# Patient Record
Sex: Male | Born: 1985
Health system: Southern US, Community
[De-identification: ages and names within clinical notes are randomized; demographics above are authoritative.]

## PROBLEM LIST (undated history)

## (undated) DIAGNOSIS — R188 Other ascites: Secondary | ICD-10-CM

## (undated) DIAGNOSIS — Z9289 Personal history of other medical treatment: Secondary | ICD-10-CM

## (undated) DIAGNOSIS — I8500 Esophageal varices without bleeding: Secondary | ICD-10-CM

## (undated) DIAGNOSIS — K7030 Alcoholic cirrhosis of liver without ascites: Secondary | ICD-10-CM

## (undated) DIAGNOSIS — D649 Anemia, unspecified: Secondary | ICD-10-CM

## (undated) DIAGNOSIS — G629 Polyneuropathy, unspecified: Secondary | ICD-10-CM

---

## 2020-06-12 ENCOUNTER — Inpatient Hospital Stay (HOSPITAL_COMMUNITY)
Admission: EM | Admit: 2020-06-12 | Discharge: 2020-06-15 | DRG: 433 | Discharge status: Against Medical Advice | Payer: Self-pay | Attending: Internal Medicine | Admitting: Internal Medicine

## 2020-06-12 ENCOUNTER — Encounter (HOSPITAL_COMMUNITY): Payer: Self-pay | Admitting: *Deleted

## 2020-06-12 ENCOUNTER — Other Ambulatory Visit: Payer: Self-pay

## 2020-06-12 ENCOUNTER — Emergency Department (HOSPITAL_COMMUNITY): Payer: Self-pay

## 2020-06-12 DIAGNOSIS — K7200 Acute and subacute hepatic failure without coma: Secondary | ICD-10-CM

## 2020-06-12 DIAGNOSIS — Z8616 Personal history of COVID-19: Secondary | ICD-10-CM

## 2020-06-12 DIAGNOSIS — Z800 Family history of malignant neoplasm of digestive organs: Secondary | ICD-10-CM

## 2020-06-12 DIAGNOSIS — R188 Other ascites: Secondary | ICD-10-CM

## 2020-06-12 DIAGNOSIS — R64 Cachexia: Secondary | ICD-10-CM | POA: Diagnosis present

## 2020-06-12 DIAGNOSIS — D539 Nutritional anemia, unspecified: Secondary | ICD-10-CM | POA: Diagnosis present

## 2020-06-12 DIAGNOSIS — K7031 Alcoholic cirrhosis of liver with ascites: Principal | ICD-10-CM | POA: Diagnosis present

## 2020-06-12 DIAGNOSIS — K766 Portal hypertension: Secondary | ICD-10-CM | POA: Diagnosis present

## 2020-06-12 DIAGNOSIS — K7011 Alcoholic hepatitis with ascites: Secondary | ICD-10-CM | POA: Diagnosis present

## 2020-06-12 DIAGNOSIS — K920 Hematemesis: Secondary | ICD-10-CM | POA: Diagnosis present

## 2020-06-12 DIAGNOSIS — F1020 Alcohol dependence, uncomplicated: Secondary | ICD-10-CM | POA: Diagnosis present

## 2020-06-12 DIAGNOSIS — K7460 Unspecified cirrhosis of liver: Secondary | ICD-10-CM | POA: Diagnosis present

## 2020-06-12 DIAGNOSIS — R161 Splenomegaly, not elsewhere classified: Secondary | ICD-10-CM | POA: Diagnosis present

## 2020-06-12 DIAGNOSIS — R7401 Elevation of levels of liver transaminase levels: Secondary | ICD-10-CM | POA: Diagnosis present

## 2020-06-12 DIAGNOSIS — D649 Anemia, unspecified: Secondary | ICD-10-CM | POA: Diagnosis present

## 2020-06-12 DIAGNOSIS — D72829 Elevated white blood cell count, unspecified: Secondary | ICD-10-CM | POA: Diagnosis present

## 2020-06-12 DIAGNOSIS — E8809 Other disorders of plasma-protein metabolism, not elsewhere classified: Secondary | ICD-10-CM

## 2020-06-12 DIAGNOSIS — F17210 Nicotine dependence, cigarettes, uncomplicated: Secondary | ICD-10-CM | POA: Diagnosis present

## 2020-06-12 LAB — GLUCOSE, PLEURAL OR PERITONEAL FLUID: Glucose, Fluid: 114 mg/dL

## 2020-06-12 LAB — LACTATE DEHYDROGENASE, PLEURAL OR PERITONEAL FLUID: LD, Fluid: 24 U/L — ABNORMAL HIGH (ref 3–23)

## 2020-06-12 LAB — CBC WITH DIFFERENTIAL/PLATELET
Abs Immature Granulocytes: 0.14 10*3/uL — ABNORMAL HIGH (ref 0.00–0.07)
Basophils Absolute: 0.1 10*3/uL (ref 0.0–0.1)
Basophils Relative: 0 %
Eosinophils Absolute: 0.1 10*3/uL (ref 0.0–0.5)
Eosinophils Relative: 0 %
HCT: 28.2 % — ABNORMAL LOW (ref 39.0–52.0)
Hemoglobin: 9.5 g/dL — ABNORMAL LOW (ref 13.0–17.0)
Immature Granulocytes: 1 %
Lymphocytes Relative: 6 %
Lymphs Abs: 1.4 10*3/uL (ref 0.7–4.0)
MCH: 35.6 pg — ABNORMAL HIGH (ref 26.0–34.0)
MCHC: 33.7 g/dL (ref 30.0–36.0)
MCV: 105.6 fL — ABNORMAL HIGH (ref 80.0–100.0)
Monocytes Absolute: 1.4 10*3/uL — ABNORMAL HIGH (ref 0.1–1.0)
Monocytes Relative: 7 %
Neutro Abs: 18.5 10*3/uL — ABNORMAL HIGH (ref 1.7–7.7)
Neutrophils Relative %: 86 %
Platelets: 204 10*3/uL (ref 150–400)
RBC: 2.67 MIL/uL — ABNORMAL LOW (ref 4.22–5.81)
RDW: 13.7 % (ref 11.5–15.5)
WBC: 21.6 10*3/uL — ABNORMAL HIGH (ref 4.0–10.5)
nRBC: 0 % (ref 0.0–0.2)

## 2020-06-12 LAB — BILIRUBIN, FRACTIONATED(TOT/DIR/INDIR)
Bilirubin, Direct: 3.6 mg/dL — ABNORMAL HIGH (ref 0.0–0.2)
Indirect Bilirubin: 5.4 mg/dL — ABNORMAL HIGH (ref 0.3–0.9)
Total Bilirubin: 9 mg/dL — ABNORMAL HIGH (ref 0.3–1.2)

## 2020-06-12 LAB — COMPREHENSIVE METABOLIC PANEL
ALT: 32 U/L (ref 0–44)
AST: 85 U/L — ABNORMAL HIGH (ref 15–41)
Albumin: 1.9 g/dL — ABNORMAL LOW (ref 3.5–5.0)
Alkaline Phosphatase: 137 U/L — ABNORMAL HIGH (ref 38–126)
Anion gap: 9 (ref 5–15)
BUN: 7 mg/dL (ref 6–20)
CO2: 23 mmol/L (ref 22–32)
Calcium: 7.6 mg/dL — ABNORMAL LOW (ref 8.9–10.3)
Chloride: 101 mmol/L (ref 98–111)
Creatinine, Ser: 0.8 mg/dL (ref 0.61–1.24)
GFR calc Af Amer: >60 mL/min (ref >60)
GFR calc non Af Amer: >60 mL/min (ref >60)
Glucose, Bld: 135 mg/dL — ABNORMAL HIGH (ref 70–99)
Potassium: 3.8 mmol/L (ref 3.5–5.1)
Sodium: 133 mmol/L — ABNORMAL LOW (ref 135–145)
Total Bilirubin: 8.6 mg/dL — ABNORMAL HIGH (ref 0.3–1.2)
Total Protein: 5.9 g/dL — ABNORMAL LOW (ref 6.5–8.1)

## 2020-06-12 LAB — CBC
HCT: 28.5 % — ABNORMAL LOW (ref 39.0–52.0)
Hemoglobin: 9.5 g/dL — ABNORMAL LOW (ref 13.0–17.0)
MCH: 35.8 pg — ABNORMAL HIGH (ref 26.0–34.0)
MCHC: 33.3 g/dL (ref 30.0–36.0)
MCV: 107.5 fL — ABNORMAL HIGH (ref 80.0–100.0)
Platelets: 201 10*3/uL (ref 150–400)
RBC: 2.65 MIL/uL — ABNORMAL LOW (ref 4.22–5.81)
RDW: 13.8 % (ref 11.5–15.5)
WBC: 18.7 10*3/uL — ABNORMAL HIGH (ref 4.0–10.5)
nRBC: 0 % (ref 0.0–0.2)

## 2020-06-12 LAB — ALBUMIN, PLEURAL OR PERITONEAL FLUID: Albumin, Fluid: <1 g/dL

## 2020-06-12 LAB — TYPE AND SCREEN
ABO/RH(D): O POS
Antibody Screen: NEGATIVE

## 2020-06-12 LAB — PROTEIN, PLEURAL OR PERITONEAL FLUID: Total protein, fluid: <3 g/dL

## 2020-06-12 LAB — ACETAMINOPHEN LEVEL: Acetaminophen (Tylenol), Serum: <10 ug/mL — ABNORMAL LOW (ref 10–30)

## 2020-06-12 LAB — PROTIME-INR
INR: 2.1 — ABNORMAL HIGH (ref 0.8–1.2)
Prothrombin Time: 23 seconds — ABNORMAL HIGH (ref 11.4–15.2)

## 2020-06-12 LAB — APTT: aPTT: 46 seconds — ABNORMAL HIGH (ref 24–36)

## 2020-06-12 LAB — LIPASE, BLOOD: Lipase: 46 U/L (ref 11–51)

## 2020-06-12 MED ORDER — SODIUM CHLORIDE 0.9% FLUSH
3.0000 mL | Freq: Two times a day (BID) | INTRAVENOUS | Status: DC
  Administered 2020-06-13 – 2020-06-14 (×2): 3 mL via INTRAVENOUS

## 2020-06-12 MED ORDER — SODIUM CHLORIDE 0.9 % IV SOLN: 250.0000 mL | INTRAVENOUS | Status: DC | PRN

## 2020-06-12 MED ORDER — IBUPROFEN 400 MG PO TABS: 400.0000 mg | ORAL_TABLET | Freq: Four times a day (QID) | ORAL | Status: DC | PRN

## 2020-06-12 MED ORDER — LIDOCAINE-EPINEPHRINE (PF) 2 %-1:200000 IJ SOLN
20.0000 mL | Freq: Once | INTRAMUSCULAR | Status: AC
  Filled 2020-06-12: qty 20

## 2020-06-12 MED ORDER — SODIUM CHLORIDE 0.9 % IV SOLN
2.0000 g | Freq: Every day | INTRAVENOUS | Status: DC
  Administered 2020-06-12 – 2020-06-14 (×3): 2 g via INTRAVENOUS
  Filled 2020-06-12: qty 2
  Filled 2020-06-12: qty 20
  Filled 2020-06-12: qty 2

## 2020-06-12 MED ORDER — LIDOCAINE-EPINEPHRINE (PF) 2 %-1:200000 IJ SOLN
INTRAMUSCULAR | Status: AC
  Administered 2020-06-12: 20 mL
  Filled 2020-06-12: qty 20

## 2020-06-12 MED ORDER — KETOROLAC TROMETHAMINE 30 MG/ML IJ SOLN
30.0000 mg | Freq: Four times a day (QID) | INTRAMUSCULAR | Status: DC | PRN
  Administered 2020-06-14: 30 mg via INTRAVENOUS
  Filled 2020-06-12: qty 1

## 2020-06-12 MED ORDER — SODIUM CHLORIDE 0.9% FLUSH: 3.0000 mL | INTRAVENOUS | Status: DC | PRN

## 2020-06-12 MED ORDER — SODIUM CHLORIDE 0.9% FLUSH: 3.0000 mL | Freq: Once | INTRAVENOUS | Status: DC

## 2020-06-12 NOTE — ED Provider Notes (Signed)
[facility]-EMERGENCY DEPT Provider Note   CSN: [id number] Arrival date & time: 06/12/20  1447     History Chief Complaint  Patient presents with  . Abdominal Pain    Matthew [name] is a 34 y.o. male.  HPI    34 year old male comes in a chief complaint of abdominal pain. Patient admits to heavy alcohol use for 10 years or so, the last 6 months or so he has been drinking until he blacks out.  End of May patient had large volume bloody emesis, and he decided to stop drinking cold [place].  Since then she has had increased abdominal girth and with that increased abdominal pain.  The pain is located in the upper quadrants.  Pain is sharp.  Pain is nonradiating.  Patient has had any bloody emesis since then.  Patient has not had alcoholic beverage since May.    History reviewed. No pertinent past medical history.  Patient Active Problem List   Diagnosis Date Noted  . Ascites of liver 06/12/2020  . Hyperbilirubinemia 06/12/2020  . Elevated AST (SGOT) 06/12/2020  . Hepatic cirrhosis (HCC) 06/12/2020  . Leukocytosis 06/12/2020  . Macrocytic anemia 06/12/2020  . Hypoalbuminemia 06/12/2020    History reviewed. No pertinent surgical history.     Family History  Problem Relation Age of Onset  . Colon cancer Mother     Social History   Tobacco Use  . Smoking status: Current Every Day Smoker    Packs/day: 0.25    Types: Cigarettes  . Smokeless tobacco: Never Used  Substance Use Topics  . Alcohol use: Not Currently  . Drug use: Never    Home Medications Prior to Admission medications   Medication Sig Start Date End Date Taking? Authorizing Provider  Multiple Vitamins-Minerals (ADULT ONE DAILY GUMMIES PO) Take 1 tablet by mouth every evening. CBD   Yes [provider]    Allergies    Patient has no known allergies.  Review of Systems   Review of Systems  Constitutional: Positive for activity change.  Gastrointestinal: Positive for  abdominal pain.  All other systems reviewed and are negative.   Physical Exam Updated Vital Signs BP 113/70 (BP Location: Left Arm)   Pulse (!) 101   Temp 98.9 F (37.2 C) (Oral)   Resp 16   Ht 5\' 7"  (1.702 m)   SpO2 94%   Physical Exam Vitals and nursing note reviewed.  Constitutional:      Appearance: He is well-developed.  HENT:     Head: Normocephalic and atraumatic.  Eyes:     General: Scleral icterus present.     Conjunctiva/sclera: Conjunctivae normal.     Pupils: Pupils are equal, round, and reactive to light.  Cardiovascular:     Rate and Rhythm: Normal rate and regular rhythm.  Pulmonary:     Effort: Pulmonary effort is normal.     Breath sounds: Normal breath sounds.  Abdominal:     General: Bowel sounds are normal. There is no distension.     Palpations: Abdomen is soft.     Tenderness: There is generalized abdominal tenderness and tenderness in the epigastric area. There is no guarding or rebound.  Musculoskeletal:        General: No deformity.     Cervical back: Normal range of motion and neck supple.  Skin:    General: Skin is warm.  Neurological:     Mental Status: He is alert and oriented to person, place, and time.  ED Results / Procedures / Treatments   Labs (all labs ordered are listed, but only abnormal results are displayed) Labs Reviewed  COMPREHENSIVE METABOLIC PANEL - Abnormal; Notable for the following components:      Result Value   Sodium 133 (*)    Glucose, Bld 135 (*)    Calcium 7.6 (*)    Total Protein 5.9 (*)    Albumin 1.9 (*)    AST 85 (*)    Alkaline Phosphatase 137 (*)    Total Bilirubin 8.6 (*)    All other components within normal limits  CBC - Abnormal; Notable for the following components:   WBC 18.7 (*)    RBC 2.65 (*)    Hemoglobin 9.5 (*)    HCT 28.5 (*)    MCV 107.5 (*)    MCH 35.8 (*)    All other components within normal limits  PROTIME-INR - Abnormal; Notable for the following components:    Prothrombin Time 23.0 (*)    INR 2.1 (*)    All other components within normal limits  APTT - Abnormal; Notable for the following components:   aPTT 46 (*)    All other components within normal limits  BODY FLUID CULTURE  SARS CORONAVIRUS 2 (TAT 6-24 HRS)  LIPASE, BLOOD  URINALYSIS, ROUTINE W REFLEX MICROSCOPIC  LACTATE DEHYDROGENASE, PLEURAL OR PERITONEAL FLUID  GLUCOSE, PLEURAL OR PERITONEAL FLUID  PROTEIN, PLEURAL OR PERITONEAL FLUID  ALBUMIN, PLEURAL OR PERITONEAL FLUID  HIV ANTIBODY (ROUTINE TESTING W REFLEX)  CBC WITH DIFFERENTIAL/PLATELET  HEMOGLOBIN A1C  COMPREHENSIVE METABOLIC PANEL  CBC  PROTIME-INR  ACETAMINOPHEN LEVEL  HEPATITIS PANEL, ACUTE  BILIRUBIN, FRACTIONATED(TOT/DIR/INDIR)  TSH  MITOCHONDRIAL ANTIBODIES  ANA W/REFLEX IF POSITIVE  IRON AND TIBC  FERRITIN  AFP TUMOR MARKER  TYPE AND SCREEN  ABO/RH    EKG None  Radiology US Abdomen Complete  Result Date: 06/12/2020 CLINICAL DATA:  Abdominal pain elevated liver enzymes. EXAM: ABDOMEN ULTRASOUND COMPLETE COMPARISON:  Non FINDINGS: Gallbladder: Sludge with small stones in the gallbladder lumen. Gallbladder wall thickening to nearly 7 mm. No reported tenderness over the gallbladder. Diffuse ascites. Common bile duct: Diameter: 3.3 mm Liver: Heterogeneous echotexture with nodular hepatic contours. Small anechoic lesion 0.6 x 0.5 x 0.5 cm in the medial segment of the LEFT hepatic lobe. No additional lesions on limited assessment. Portal vein is patent on color Doppler imaging with normal direction of blood flow towards the liver. IVC: Grossly normal within visualized portions. Pancreas: Pancreas not well seen along with midline structures. Spleen: Spleen is enlarged measuring approximately 15 cm greatest dimension. Right Kidney: Length: 11.1 cm. Limited assessment without hydronephrosis. Left Kidney: Length: 10.1 cm. Anechoic cyst in the interpolar LEFT kidney 0.7 x 0.7 x 1.2 cm Abdominal aorta: No aneurysm  visualized. Distal aorta and iliacs not well seen obscured by bowel gas. Other findings: Large volume ascites. IMPRESSION: 1. Findings of hepatic cirrhosis and portal hypertension with ascites and splenomegaly. 2. Ascites and liver dysfunction gallbladder wall thickening is nonspecific, essentially equivocal for acute gallbladder pathology. There is also suggestion of some bowel edema which can be seen in the setting of portal hypertension. If there is continued concern for gallbladder pathology HIDA scan may be helpful. MRI and or MRCP is likely to be challenging in this patient with diffuse ascites and without signs of common bile duct dilation at this time. 3. Furthermore, imaging of the liver on follow-up with CT may be warranted given suggestion of advanced, chronic and decompensated liver disease.  Electronically Signed   By: Donzetta Kohut M.D.   On: 06/12/2020 18:19    Procedures .Critical Care Performed by: Derwood Kaplan, MD Authorized by: Derwood Kaplan, MD   Critical care provider statement:    Critical care time (minutes):  35   Critical care was necessary to treat or prevent imminent or life-threatening deterioration of the following conditions:  Hepatic failure   Critical care was time spent personally by me on the following activities:  Discussions with consultants, evaluation of patient's response to treatment, examination of patient, ordering and performing treatments and interventions, ordering and review of laboratory studies, ordering and review of radiographic studies, pulse oximetry, re-evaluation of patient's condition, obtaining history from patient or surrogate and review of old charts .Paracentesis  Date/Time: 06/12/2020 10:09 PM Performed by: Derwood Kaplan, MD Authorized by: Derwood Kaplan, MD   Consent:    Consent obtained:  Verbal   Consent given by:  Patient   Risks discussed:  Bleeding, bowel perforation, infection and pain Universal protocol:    Procedure  explained and questions answered to patient or proxy's satisfaction: yes     Patient identity confirmed:  Arm band Pre-procedure details:    Procedure purpose:  Diagnostic   Preparation: Patient was prepped and draped in usual sterile fashion   Procedure details:    Needle gauge:  18   Puncture site:  L lower quadrant   Fluid removed amount:  50   Fluid appearance:  Clear   Dressing:  4x4 sterile gauze Post-procedure details:    Patient tolerance of procedure:  Tolerated well, no immediate complications   (including critical care time)  Medications Ordered in ED Medications  sodium chloride flush (NS) 0.9 % injection 3 mL (has no administration in time range)  lidocaine-EPINEPHrine (XYLOCAINE W/EPI) 2 %-1:200000 (PF) injection (has no administration in time range)  sodium chloride flush (NS) 0.9 % injection 3 mL (has no administration in time range)  sodium chloride flush (NS) 0.9 % injection 3 mL (has no administration in time range)  0.9 %  sodium chloride infusion (has no administration in time range)  ibuprofen (ADVIL) tablet 400 mg (has no administration in time range)  ketorolac (TORADOL) 30 MG/ML injection 30 mg (has no administration in time range)  lidocaine-EPINEPHrine (XYLOCAINE W/EPI) 2 %-1:200000 (PF) injection 20 mL (has no administration in time range)    ED Course  I have reviewed the triage vital signs and the nursing notes.  Pertinent labs & imaging results that were available during my care of the patient were reviewed by me and considered in my medical decision making (see chart for details).    MDM Rules/Calculators/A&P                          34 year old comes in a chief complaint of abdominal pain and increased girth.  He is noted to be jaundiced during my assessment.  He has history of heavy alcohol use and has been sober for the last month.   On exam he has diffuse tenderness without rebound or guarding.  Diagnostic paracentesis to be completed  because of elevated white count.  Suspicion for SBP is low. Hemoglobin is stable.  We will admit him to the hospital for work-up on his liver failure.  Bili and INR are both elevated.  Patient denies any fevers, chills.  Portal venous thrombosis is another possibility if the peritoneal fluid is completely clear and patient continues to have pain.  Final  Clinical Impression(s) / ED Diagnoses Final diagnoses:  Acute liver failure without hepatic coma    Rx / DC Orders ED Discharge Orders    None       Derwood Kaplan, MD 06/12/20 2211

## 2020-06-12 NOTE — ED Notes (Signed)
Per Dr.Nanavati, waiting to transport pt to floor so he can obtain peritoneal fluid samples.  Will transport to room 1344 afterwards.

## 2020-06-12 NOTE — H&P (Addendum)
History and Physical    Matthew [name] ZWC:[id number] DOB: [birth year] DOA: 06/12/2020  PCP: Patient, No Pcp Per  Patient coming from: home  I have personally briefly reviewed patient's old medical records in [facility]  Chief Complaint: abdominal pain, swelling and shortness of breath.   HPI: Matthew [name] is a 34 y.o. male with medical history significant of alcoholism who stopped cold [place] in may of this year. He came in today for abdominal swelling and shortness of breath x one month. 10 year history of alcohol abuse. 5-8 drinks/day with vodka.    He states May 23rd he threw up blood and on may 25th he quit drinking completely. During that week he states his stomach became significantly bloated and tender. He ate nothing but fresh veggies and fruits to see if this would fix it, but this did not help. He also became short of breath with dyspnea on exertion due to the stomach swelling.  His abdominal swelling and shortness of breath have stayed the same over the course of June. Today he states he has no pain if he just lays in bed, but his pain is 5-9/10 in his stomach, mainly RUQ and middle of stomach if he is walking or trying to get out of bed.  He states his feet are swollen. Denies yellow coloring to his eyes or skin. He denies any fever/chills. Denies palmar erythema or other skin rashes. Denies any nausea/vomiting and has not thrown up again since that time may. He does have very loose stools that are frequent. Denies any confusion or neurological deficits.  He does smoke cigarettes denies any other drug use. He denies any tylenol use.    No covid vaccines.   ED Course: vitals stable on arrival and throughout ER visit. Labs significant for hyperbilirubinemia to 8.6, elevated AST to 85 and decreased albumin, protein and calcium. INR elevated to 2.1. CBC shows macrocytic anemia with hemoglobin of 9.5 and hematocrit of 28.5. marked leukocytosis to 18.7, no diff done. Liver  ultrasound shows:  1. Findings of hepatic cirrhosis and portal hypertension with ascites and splenomegaly. 2. Ascites and liver dysfunction gallbladder wall thickening is nonspecific, essentially equivocal for acute gallbladder pathology. There is also suggestion of some bowel edema which can be seen in the setting of portal hypertension. If there is continued concern for gallbladder pathology HIDA scan may be helpful. MRI and or MRCP is likely to be challenging in this patient with diffuse ascites and without signs of common bile duct dilation at this time. 3. Furthermore, imaging of the liver on follow-up with CT may be warranted given suggestion of advanced, chronic and decompensated liver disease. Patient given pain meds and I asked for ER doc to do paracentesis with ascites/tenderness and shortness of breath. Asked to admit for new ascites/cirrhosis/liver failure.   Review of Systems: As per HPI otherwise all other systems reviewed and are negative.   History reviewed. No pertinent past medical history.  History reviewed. No pertinent surgical history.  Social History  reports that he has been smoking cigarettes. He has been smoking about 0.25 packs per day. He has never used smokeless tobacco. He reports previous alcohol use. He reports that he does not use drugs.  No Known Allergies  Family History  Problem Relation Age of Onset  . Colon cancer Mother      Prior to Admission medications   Medication Sig Start Date End Date Taking? Authorizing Provider  Multiple Vitamins-Minerals (ADULT ONE DAILY GUMMIES PO) Take  1 tablet by mouth every evening. CBD   Yes [provider]    Physical Exam: Vitals:   06/12/20 1900 06/12/20 1904 06/12/20 2000 06/12/20 2030  BP: 106/65 106/65 112/77 113/70  Pulse: 95 95 (!) 102 (!) 101  Resp:  18 17 16   Temp:  98.9 F (37.2 C)    TempSrc:  Oral    SpO2: 96% 96% 96% 94%  Height:        Constitutional: NAD, calm,  comfortable Vitals:   06/12/20 1900 06/12/20 1904 06/12/20 2000 06/12/20 2030  BP: 106/65 106/65 112/77 113/70  Pulse: 95 95 (!) 102 (!) 101  Resp:  18 17 16   Temp:  98.9 F (37.2 C)    TempSrc:  Oral    SpO2: 96% 96% 96% 94%  Height:       Eyes: PERRL, scleral jaundice  ENMT: Mucous membranes are mildly dry. Posterior pharynx clear of any exudate or lesions.Normal dentition.  Neck: normal, supple, no masses, no thyromegaly Respiratory: clear to auscultation bilaterally, no wheezing, no crackles. Normal respiratory effort. No accessory muscle use.  Cardiovascular: Regular rate and rhythm, no murmurs / rubs / gallops. + pitting edema above ankle with edema to mid calf. > on left. . 2+ pedal pulses. No carotid bruits.  Abdomen: markedly TTP in RUQ, epigastric area. Mild TTP generalized abdomen. +ascites with fluid wave. BS+ no guarding or rebound.   Musculoskeletal: no clubbing / cyanosis. No joint deformity upper and lower extremities. Good ROM, no contractures. Normal muscle tone.  Skin: no rashes, lesions, ulcers. No induration. No palmar erythema or telangiectasias. Jaundice on face to neck.  Neurologic: CN 2-12 grossly intact. Sensation intact, DTR normal. Strength 5/5 in all 4.  Psychiatric: Normal judgment and insight. Alert and oriented x 3. Normal mood.   Labs on Admission: I have personally reviewed following labs and imaging studies  CBC: Recent Labs  Lab 06/12/20 1526  WBC 18.7*  HGB 9.5*  HCT 28.5*  MCV 107.5*  PLT 201    Basic Metabolic Panel: Recent Labs  Lab 06/12/20 1526  NA 133*  K 3.8  CL 101  CO2 23  GLUCOSE 135*  BUN 7  CREATININE 0.80  CALCIUM 7.6*    GFR: CrCl cannot be calculated (Unknown ideal weight.).  Liver Function Tests: Recent Labs  Lab 06/12/20 1526  AST 85*  ALT 32  ALKPHOS 137*  BILITOT 8.6*  PROT 5.9*  ALBUMIN 1.9*    Urine analysis: No results found for: COLORURINE, APPEARANCEUR, LABSPEC, PHURINE, GLUCOSEU, HGBUR,  BILIRUBINUR, KETONESUR, PROTEINUR, UROBILINOGEN, NITRITE, LEUKOCYTESUR  Radiological Exams on Admission: 08/13/20 Abdomen Complete  Result Date: 06/12/2020 CLINICAL DATA:  Abdominal pain elevated liver enzymes. EXAM: ABDOMEN ULTRASOUND COMPLETE COMPARISON:  Non FINDINGS: Gallbladder: Sludge with small stones in the gallbladder lumen. Gallbladder wall thickening to nearly 7 mm. No reported tenderness over the gallbladder. Diffuse ascites. Common bile duct: Diameter: 3.3 mm Liver: Heterogeneous echotexture with nodular hepatic contours. Small anechoic lesion 0.6 x 0.5 x 0.5 cm in the medial segment of the LEFT hepatic lobe. No additional lesions on limited assessment. Portal vein is patent on color Doppler imaging with normal direction of blood flow towards the liver. IVC: Grossly normal within visualized portions. Pancreas: Pancreas not well seen along with midline structures. Spleen: Spleen is enlarged measuring approximately 15 cm greatest dimension. Right Kidney: Length: 11.1 cm. Limited assessment without hydronephrosis. Left Kidney: Length: 10.1 cm. Anechoic cyst in the interpolar LEFT kidney 0.7 x 0.7 x 1.2 cm  Abdominal aorta: No aneurysm visualized. Distal aorta and iliacs not well seen obscured by bowel gas. Other findings: Large volume ascites. IMPRESSION: 1. Findings of hepatic cirrhosis and portal hypertension with ascites and splenomegaly. 2. Ascites and liver dysfunction gallbladder wall thickening is nonspecific, essentially equivocal for acute gallbladder pathology. There is also suggestion of some bowel edema which can be seen in the setting of portal hypertension. If there is continued concern for gallbladder pathology HIDA scan may be helpful. MRI and or MRCP is likely to be challenging in this patient with diffuse ascites and without signs of common bile duct dilation at this time. 3. Furthermore, imaging of the liver on follow-up with CT may be warranted given suggestion of advanced, chronic and  decompensated liver disease. Electronically Signed   By: Donzetta Kohut M.D.   On: 06/12/2020 18:19    Assessment/Plan Principal Problem:   Hepatic cirrhosis (HCC) Active Problems:   Ascites of liver   Hyperbilirubinemia   Elevated AST (SGOT)   Leukocytosis   Macrocytic anemia   Hypoalbuminemia  1) hepatic cirrhosis with portal hypertension -MELD score of 23 -likely secondary to alcohol abuse, but checking labs including ANA, antimitochondrial antibodies, tylenol, hepatitis and AFP tumor marker.   -? If any gallbladder disease/malignancy concurrently or all due to liver disease.  - -GI consult and appreciated.  2) ascites of liver -secondary to #1.  -ER physician performed paracentesis and fluid analysis pending. Due to elevated white count trending upward starting ceftriaxone.  -CT ordered r/o PVT.   -ascites studies does not look to be SBP. SAAG ,1.0  Will continue abx and let GI discontinue tomorrow after seen if needed.  -may need lasix started -salt restricted diet.   3) hyperbilirubinemia -secondary to #1 vs. Primary biliary disease.  -both indirect and direct bili are elevated.  -labs pending (tsh, ANA, anti mitochondrial ab) -GI consulted   4) elevated AST -secondary to #1 from alcohol. Trend labs.  -GI consulted -hep panel, ANA, tylenol and ethanol level pending    5) leukocytosis -18.7-->21.6 with shift.  -ascites analysis not significant for SBP.  -starting rocephin 2g/daily.  -culture urine/blood  6) macrocytic anemia -likely secondary to #1 and alcohol abuse.  -ferritin/tibc/iron pending -b12 and folate pending    7) hypoalbuminemia -secondary to #1, concern for severe disease   DVT prophylaxis: SCDs   Code Status:   Full  Family Communication:  Patient only  Disposition Plan:   Patient is from:  home  Anticipated DC to:  home  Anticipated DC date:  2-3 days  Anticipated DC barriers: Lack of insurance, support.  Consults called:  GI to  be called in the AM  Admission status:  In patient.     Orland Mustard MD [facility]  How to contact the [facility] Attending or Consulting provider 7A - 7P or covering provider during after hours 7P -7A, for this patient?   1. Check the care team in [facility] and look for a) attending/consulting [facility] provider listed and b) the [facility] team listed 2. Log into [email or web address] and use [facility] universal password to access. If you do not have the password, please contact the hospital operator. 3. Locate the [facility] provider you are looking for under [facility] and page to a number that you can be directly reached. 4. If you still have difficulty reaching the provider, please page the [facility] (Director on Call) for the Hospitalists listed on amion for assistance.  06/12/2020, 9:16 PM

## 2020-06-12 NOTE — ED Triage Notes (Addendum)
Pt states he stopped drinking ETOH 7 weeks ago, over the last week he states his "stomach has blown up" pt noted to be jaundice in color with ? abd ascites. No N/V pt with swelling in lower ext as well.

## 2020-06-12 NOTE — ED Notes (Signed)
Patient denies pain.

## 2020-06-12 NOTE — ED Notes (Signed)
Supplies at bedside for MD to obtain peritoneal fluid sample.

## 2020-06-13 ENCOUNTER — Inpatient Hospital Stay (HOSPITAL_COMMUNITY): Payer: Self-pay

## 2020-06-13 ENCOUNTER — Encounter (HOSPITAL_COMMUNITY): Payer: Self-pay | Admitting: Family Medicine

## 2020-06-13 DIAGNOSIS — K7031 Alcoholic cirrhosis of liver with ascites: Principal | ICD-10-CM

## 2020-06-13 DIAGNOSIS — R7401 Elevation of levels of liver transaminase levels: Secondary | ICD-10-CM

## 2020-06-13 DIAGNOSIS — R188 Other ascites: Secondary | ICD-10-CM

## 2020-06-13 DIAGNOSIS — D539 Nutritional anemia, unspecified: Secondary | ICD-10-CM

## 2020-06-13 DIAGNOSIS — D72829 Elevated white blood cell count, unspecified: Secondary | ICD-10-CM

## 2020-06-13 DIAGNOSIS — E8809 Other disorders of plasma-protein metabolism, not elsewhere classified: Secondary | ICD-10-CM

## 2020-06-13 LAB — COMPREHENSIVE METABOLIC PANEL
ALT: 31 U/L (ref 0–44)
AST: 76 U/L — ABNORMAL HIGH (ref 15–41)
Albumin: 1.9 g/dL — ABNORMAL LOW (ref 3.5–5.0)
Alkaline Phosphatase: 126 U/L (ref 38–126)
Anion gap: 10 (ref 5–15)
BUN: 8 mg/dL (ref 6–20)
CO2: 23 mmol/L (ref 22–32)
Calcium: 8 mg/dL — ABNORMAL LOW (ref 8.9–10.3)
Chloride: 100 mmol/L (ref 98–111)
Creatinine, Ser: 0.75 mg/dL (ref 0.61–1.24)
GFR calc Af Amer: >60 mL/min (ref >60)
GFR calc non Af Amer: >60 mL/min (ref >60)
Glucose, Bld: 110 mg/dL — ABNORMAL HIGH (ref 70–99)
Potassium: 4 mmol/L (ref 3.5–5.1)
Sodium: 133 mmol/L — ABNORMAL LOW (ref 135–145)
Total Bilirubin: 9.2 mg/dL — ABNORMAL HIGH (ref 0.3–1.2)
Total Protein: 5.8 g/dL — ABNORMAL LOW (ref 6.5–8.1)

## 2020-06-13 LAB — IRON AND TIBC
Iron: 66 ug/dL (ref 45–182)
Saturation Ratios: 64 % — ABNORMAL HIGH (ref 17.9–39.5)
TIBC: 74 ug/dL — ABNORMAL LOW (ref 250–450)
UIBC: 37 ug/dL

## 2020-06-13 LAB — HEPATITIS PANEL, ACUTE
HCV Ab: NONREACTIVE
Hep A IgM: NONREACTIVE
Hep B C IgM: NONREACTIVE
Hepatitis B Surface Ag: NONREACTIVE

## 2020-06-13 LAB — URINALYSIS, ROUTINE W REFLEX MICROSCOPIC
Glucose, UA: 50 mg/dL — AB
Ketones, ur: NEGATIVE mg/dL
Leukocytes,Ua: NEGATIVE
Nitrite: NEGATIVE
Protein, ur: 30 mg/dL — AB
Specific Gravity, Urine: 1.03 (ref 1.005–1.030)
pH: 5 (ref 5.0–8.0)

## 2020-06-13 LAB — CBC
HCT: 28.4 % — ABNORMAL LOW (ref 39.0–52.0)
Hemoglobin: 9.5 g/dL — ABNORMAL LOW (ref 13.0–17.0)
MCH: 35.8 pg — ABNORMAL HIGH (ref 26.0–34.0)
MCHC: 33.5 g/dL (ref 30.0–36.0)
MCV: 107.2 fL — ABNORMAL HIGH (ref 80.0–100.0)
Platelets: 198 10*3/uL (ref 150–400)
RBC: 2.65 MIL/uL — ABNORMAL LOW (ref 4.22–5.81)
RDW: 13.7 % (ref 11.5–15.5)
WBC: 20.9 10*3/uL — ABNORMAL HIGH (ref 4.0–10.5)
nRBC: 0 % (ref 0.0–0.2)

## 2020-06-13 LAB — FERRITIN: Ferritin: 333 ng/mL (ref 24–336)

## 2020-06-13 LAB — T4, FREE: Free T4: 1.04 ng/dL (ref 0.61–1.12)

## 2020-06-13 LAB — PROTIME-INR
INR: 2.2 — ABNORMAL HIGH (ref 0.8–1.2)
Prothrombin Time: 23.7 seconds — ABNORMAL HIGH (ref 11.4–15.2)

## 2020-06-13 LAB — SARS CORONAVIRUS 2 (TAT 6-24 HRS): SARS Coronavirus 2: NEGATIVE

## 2020-06-13 LAB — ABO/RH: ABO/RH(D): O POS

## 2020-06-13 LAB — FOLATE: Folate: 5.6 ng/mL — ABNORMAL LOW (ref >5.9)

## 2020-06-13 LAB — HIV ANTIBODY (ROUTINE TESTING W REFLEX): HIV Screen 4th Generation wRfx: NONREACTIVE

## 2020-06-13 LAB — ETHANOL: Alcohol, Ethyl (B): <10 mg/dL (ref <10)

## 2020-06-13 LAB — TSH: TSH: 7.011 u[IU]/mL — ABNORMAL HIGH (ref 0.350–4.500)

## 2020-06-13 LAB — VITAMIN B12: Vitamin B-12: 1394 pg/mL — ABNORMAL HIGH (ref 180–914)

## 2020-06-13 LAB — HEMOGLOBIN A1C
Hgb A1c MFr Bld: 3.6 % — ABNORMAL LOW (ref 4.8–5.6)
Mean Plasma Glucose: 56.62 mg/dL

## 2020-06-13 MED ORDER — IOHEXOL 9 MG/ML PO SOLN: 500.0000 mL | ORAL | Status: AC

## 2020-06-13 MED ORDER — FUROSEMIDE 40 MG PO TABS
40.0000 mg | ORAL_TABLET | Freq: Two times a day (BID) | ORAL | Status: DC
  Administered 2020-06-13 – 2020-06-14 (×3): 40 mg via ORAL
  Filled 2020-06-13 (×3): qty 1

## 2020-06-13 MED ORDER — IOHEXOL 9 MG/ML PO SOLN
ORAL | Status: AC
  Filled 2020-06-13: qty 1000

## 2020-06-13 MED ORDER — IOHEXOL 300 MG/ML  SOLN
100.0000 mL | Freq: Once | INTRAMUSCULAR | Status: AC | PRN
  Administered 2020-06-13: 100 mL via INTRAVENOUS

## 2020-06-13 MED ORDER — SPIRONOLACTONE 100 MG PO TABS
100.0000 mg | ORAL_TABLET | Freq: Every day | ORAL | Status: DC
  Administered 2020-06-13 – 2020-06-14 (×2): 100 mg via ORAL
  Filled 2020-06-13 (×3): qty 1

## 2020-06-13 MED ORDER — SODIUM CHLORIDE (PF) 0.9 % IJ SOLN
INTRAMUSCULAR | Status: AC
  Filled 2020-06-13: qty 50

## 2020-06-13 NOTE — Plan of Care (Signed)

## 2020-06-13 NOTE — Progress Notes (Addendum)
PROGRESS NOTE    Matthew [name]  WNI:[id number] DOB: November 17, 1986 DOA: 06/12/2020 PCP: Patient, No Pcp Per   Brief Narrative:  Matthew [name] is a 34 y.o. male with medical history significant of alcoholism who stopped cold [place] in may of this year. He came in today for abdominal swelling and shortness of breath x one month. 10 year history of alcohol abuse. 5-8 drinks/day with vodka. Pt states May 23rd he threw up blood and on may 25th he quit drinking completely. During that week he states his stomach became significantly bloated and tender. He ate nothing but fresh veggies and fruits to see if this would fix it, but this did not help. He also became short of breath with dyspnea on exertion due to the stomach swelling.  His abdominal swelling and shortness of breath have stayed the same over the course of June. Today he states he has no pain if he just lays in bed, but his pain is 5-9/10 in his stomach, mainly RUQ and middle of stomach if he is walking or trying to get out of bed.  He states his feet are swollen. Denies yellow coloring to his eyes or skin. He denies any fever/chills. Denies palmar erythema or other skin rashes. Denies any nausea/vomiting and has not thrown up again since that time may. He does have very loose stools that are frequent. Denies any confusion or neurological deficits.  He does smoke cigarettes denies any other drug use. He denies any tylenol use. In ED: vitals stable on arrival and throughout ER visit. Labs significant for hyperbilirubinemia to 8.6, elevated AST to 85 and decreased albumin, protein and calcium. INR elevated to 2.1. CBC shows macrocytic anemia with hemoglobin of 9.5 and hematocrit of 28.5. marked leukocytosis to 18.7, no diff done. Liver ultrasound: remarkable for cirrhotic findings and portal HTN w/ ascites.   Assessment & Plan:   Principal Problem:   Hepatic cirrhosis (HCC) Active Problems:   Ascites of liver   Hyperbilirubinemia   Elevated  AST (SGOT)   Leukocytosis   Macrocytic anemia   Hypoalbuminemia   Acutely decompensated hepatic cirrhosis with portal hypertension and profound ascites SIRS criteria - cannot rule out SBP, POA - MELD score of 23 - Likely secondary to alcohol abuse given history - follow ANA, antimitochondrial antibodies, hepatitis and AFP tumor marker. - Hepatitis panel nonreactive, EtOH level unremarkable, - Ceftriaxone to cover for possible SBP, patient does meet SIRS criteria (leukocytosis).  - CT remarkable for cholelithiasis vs sludging; unable to evaluate middle/left hepatic veins - Few WBCs on peritoneal fluid - ? SBP, SAAG ,1.0 - continue Abx until GI evaluates - Fluid restrict/salt restricted diet - Follow I/Os - Follow cultures  Elevated AST - T-bili continues to rise concerning for ongoing process Hepatic Function Latest Ref Rng & Units 06/13/2020 06/12/2020 06/12/2020  Total Protein 6.5 - 8.1 g/dL 5.8(L) - 5.9(L)  Albumin 3.5 - 5.0 g/dL 1.9(L) - 1.9(L)  AST 15 - 41 U/L 76(H) - 85(H)  ALT 0 - 44 U/L 31 - 32  Alk Phosphatase 38 - 126 U/L 126 - 137(H)  Total Bilirubin 0.3 - 1.2 mg/dL 9.2(H) 9.0(H) 8.6(H)  Bilirubin, Direct 0.0 - 0.2 mg/dL - 3.6(H) -   Macrocytic anemia - likely secondary to #1 and alcohol abuse.  -Iron panel remarkable for low TIBC, B12 within normal limits, folate decreased -TSH elevated, T4 within normal limits   Hypoalbuminemia -secondary to #1, concern for severe disease   DVT prophylaxis: SCDs   Code Status:  Full  Family Communication: None present  Status is: Inpatient  Dispo: The patient is from: Home              Anticipated d/c is to: Home              Anticipated d/c date is: 48 to 72 hours pending clinical course              Patient currently not medically stable for discharge due to ongoing need for further evaluation imaging and possible procedure with GI.  Consultants:   GI, [facility]  Procedures:   Paracentesis 06/12/2020  Antimicrobials:    Ceftriaxone initiated 06/12/2020  Subjective: No acute issues or events overnight, abdominal pain nausea and diarrhea markedly improving denies headache, fevers, chills, constipation, shortness of breath, chest pain.  Objective: Vitals:   06/12/20 2230 06/13/20 0149 06/13/20 0500 06/13/20 0625  BP:  111/71  110/70  Pulse:  89  (!) 103  Resp:  15  16  Temp:  98 F (36.7 C)  99 F (37.2 C)  TempSrc:    Oral  SpO2:  98%  97%  Weight: 61.8 kg  62.4 kg   Height: _0  (1.702 m)       Intake/Output Summary (Last 24 hours) at 06/13/2020 0738 Last data filed at 06/13/2020 0656 Gross per 24 hour  Intake 100 ml  Output 0 ml  Net 100 ml   Filed Weights   06/12/20 2230 06/13/20 0500  Weight: 61.8 kg 62.4 kg    Examination:  General:  Pleasantly resting in bed, No acute distress. HEENT:  Normocephalic atraumatic.  Sclerae nonicteric, noninjected.  Extraocular movements intact bilaterally. Neck:  Without mass or deformity.  Trachea is midline. Lungs:  Clear to auscultate bilaterally without rhonchi, wheeze, or rales. Heart:  Regular rate and rhythm.  Without murmurs, rubs, or gallops. Abdomen:  Soft, diffusely tender, nondistended.  Without guarding or rebound. Extremities: Without cyanosis, clubbing, edema, or obvious deformity. Vascular:  Dorsalis pedis and posterior tibial pulses palpable bilaterally. Skin:  Warm and dry, no erythema, no ulcerations.  Data Reviewed: I have personally reviewed following labs and imaging studies  CBC: Recent Labs  Lab 06/12/20 1526 06/12/20 2100 06/13/20 0701  WBC 18.7* 21.6* 20.9*  NEUTROABS  --  18.5*  --   HGB 9.5* 9.5* 9.5*  HCT 28.5* 28.2* 28.4*  MCV 107.5* 105.6* 107.2*  PLT 201 204 824   Basic Metabolic Panel: Recent Labs  Lab 06/12/20 1526  NA 133*  K 3.8  CL 101  CO2 23  GLUCOSE 135*  BUN 7  CREATININE 0.80  CALCIUM 7.6*   GFR: Estimated Creatinine Clearance: 113.8 mL/min (by C-G formula based on SCr of 0.8  mg/dL). Liver Function Tests: Recent Labs  Lab 06/12/20 1526 06/12/20 2100  AST 85*  --   ALT 32  --   ALKPHOS 137*  --   BILITOT 8.6* 9.0*  PROT 5.9*  --   ALBUMIN 1.9*  --    Recent Labs  Lab 06/12/20 1526  LIPASE 46   No results for input(s): AMMONIA in the last 168 hours. Coagulation Profile: Recent Labs  Lab 06/12/20 1626 06/13/20 0701  INR 2.1* 2.2*   Cardiac Enzymes: No results for input(s): CKTOTAL, CKMB, CKMBINDEX, TROPONINI in the last 168 hours. BNP (last 3 results) No results for input(s): PROBNP in the last 8760 hours. HbA1C: Recent Labs    06/12/20 2100  HGBA1C 3.6*   CBG: No results for input(s): GLUCAP  in the last 168 hours. Lipid Profile: No results for input(s): CHOL, HDL, LDLCALC, TRIG, CHOLHDL, LDLDIRECT in the last 72 hours. Thyroid Function Tests: Recent Labs    06/12/20 2100  TSH 7.011*   Anemia Panel: Recent Labs    06/12/20 2056  FERRITIN 333  TIBC 74*  IRON 66   Sepsis Labs: No results for input(s): PROCALCITON, LATICACIDVEN in the last 168 hours.  Recent Results (from the past 240 hour(s))  SARS CORONAVIRUS 2 (TAT 6-24 HRS) Nasopharyngeal Nasopharyngeal Swab     Status: None   Collection Time: 06/12/20  9:00 PM   Specimen: Nasopharyngeal Swab  Result Value Ref Range Status   SARS Coronavirus 2 NEGATIVE NEGATIVE Final    Comment: (NOTE) SARS-CoV-2 target nucleic acids are NOT DETECTED.  The SARS-CoV-2 RNA is generally detectable in upper and lower respiratory specimens during the acute phase of infection. Negative results do not preclude SARS-CoV-2 infection, do not rule out co-infections with other pathogens, and should not be used as the sole basis for treatment or other patient management decisions. Negative results must be combined with clinical observations, patient history, and epidemiological information. The expected result is Negative.  Fact Sheet for  Patients: SugarRoll.be  Fact Sheet for Healthcare Providers: https://www.woods-mathews.com/  This test is not yet approved or cleared by the [place] FDA and  has been authorized for detection and/or diagnosis of SARS-CoV-2 by FDA under an Emergency Use Authorization (EUA). This EUA will remain  in effect (meaning this test can be used) for the duration of the COVID-19 declaration under Se ction 564(b)(1) of the Act, 21 U.S.C. section 360bbb-3(b)(1), unless the authorization is terminated or revoked sooner.  Performed at [facility], [place] [address] [place], [place] [address]   Body fluid culture     Status: None (Preliminary result)   Collection Time: 06/12/20  9:59 PM   Specimen: Peritoneal Cavity; Peritoneal Fluid  Result Value Ref Range Status   Specimen Description PERITONEAL CAVITY FLUID  Final   Special Requests   Final    NONE Performed at [facility], [place] [address], [place], [place] [address]    Gram Stain PENDING  Incomplete   Culture PENDING  Incomplete   Report Status PENDING  Incomplete         Radiology Studies: US Abdomen Complete  Result Date: 06/12/2020 CLINICAL DATA:  Abdominal pain elevated liver enzymes. EXAM: ABDOMEN ULTRASOUND COMPLETE COMPARISON:  Non FINDINGS: Gallbladder: Sludge with small stones in the gallbladder lumen. Gallbladder wall thickening to nearly 7 mm. No reported tenderness over the gallbladder. Diffuse ascites. Common bile duct: Diameter: 3.3 mm Liver: Heterogeneous echotexture with nodular hepatic contours. Small anechoic lesion 0.6 x 0.5 x 0.5 cm in the medial segment of the LEFT hepatic lobe. No additional lesions on limited assessment. Portal vein is patent on color Doppler imaging with normal direction of blood flow towards the liver. IVC: Grossly normal within visualized portions. Pancreas: Pancreas not well seen along with midline structures. Spleen: Spleen is enlarged  measuring approximately 15 cm greatest dimension. Right Kidney: Length: 11.1 cm. Limited assessment without hydronephrosis. Left Kidney: Length: 10.1 cm. Anechoic cyst in the interpolar LEFT kidney 0.7 x 0.7 x 1.2 cm Abdominal aorta: No aneurysm visualized. Distal aorta and iliacs not well seen obscured by bowel gas. Other findings: Large volume ascites. IMPRESSION: 1. Findings of hepatic cirrhosis and portal hypertension with ascites and splenomegaly. 2. Ascites and liver dysfunction gallbladder wall thickening is nonspecific, essentially equivocal for  acute gallbladder pathology. There is also suggestion of some bowel edema which can be seen in the setting of portal hypertension. If there is continued concern for gallbladder pathology HIDA scan may be helpful. MRI and or MRCP is likely to be challenging in this patient with diffuse ascites and without signs of common bile duct dilation at this time. 3. Furthermore, imaging of the liver on follow-up with CT may be warranted given suggestion of advanced, chronic and decompensated liver disease. Electronically Signed   By: Zetta Bills M.D.   On: 06/12/2020 18:19        Scheduled Meds: . iohexol      . iohexol  500 mL Oral Q1H  . sodium chloride flush  3 mL Intravenous Once  . sodium chloride flush  3 mL Intravenous Q12H   Continuous Infusions: . sodium chloride    . cefTRIAXone (ROCEPHIN)  IV 2 g (06/12/20 2300)     LOS: 1 day   Time spent: 40 min  Little Ishikawa, DO [facility]  If 7PM-7AM, please contact night-coverage [email or web address]  06/13/2020, 7:38 AM

## 2020-06-14 ENCOUNTER — Inpatient Hospital Stay (HOSPITAL_COMMUNITY): Payer: Self-pay

## 2020-06-14 LAB — CBC
HCT: 28.4 % — ABNORMAL LOW (ref 39.0–52.0)
Hemoglobin: 9.4 g/dL — ABNORMAL LOW (ref 13.0–17.0)
MCH: 35.2 pg — ABNORMAL HIGH (ref 26.0–34.0)
MCHC: 33.1 g/dL (ref 30.0–36.0)
MCV: 106.4 fL — ABNORMAL HIGH (ref 80.0–100.0)
Platelets: 176 10*3/uL (ref 150–400)
RBC: 2.67 MIL/uL — ABNORMAL LOW (ref 4.22–5.81)
RDW: 13.7 % (ref 11.5–15.5)
WBC: 21.3 10*3/uL — ABNORMAL HIGH (ref 4.0–10.5)
nRBC: 0 % (ref 0.0–0.2)

## 2020-06-14 LAB — COMPREHENSIVE METABOLIC PANEL
ALT: 29 U/L (ref 0–44)
AST: 77 U/L — ABNORMAL HIGH (ref 15–41)
Albumin: 1.7 g/dL — ABNORMAL LOW (ref 3.5–5.0)
Alkaline Phosphatase: 127 U/L — ABNORMAL HIGH (ref 38–126)
Anion gap: 9 (ref 5–15)
BUN: 5 mg/dL — ABNORMAL LOW (ref 6–20)
CO2: 22 mmol/L (ref 22–32)
Calcium: 7.5 mg/dL — ABNORMAL LOW (ref 8.9–10.3)
Chloride: 99 mmol/L (ref 98–111)
Creatinine, Ser: 0.55 mg/dL — ABNORMAL LOW (ref 0.61–1.24)
GFR calc Af Amer: >60 mL/min (ref >60)
GFR calc non Af Amer: >60 mL/min (ref >60)
Glucose, Bld: 114 mg/dL — ABNORMAL HIGH (ref 70–99)
Potassium: 3.8 mmol/L (ref 3.5–5.1)
Sodium: 130 mmol/L — ABNORMAL LOW (ref 135–145)
Total Bilirubin: 7.1 mg/dL — ABNORMAL HIGH (ref 0.3–1.2)
Total Protein: 5.4 g/dL — ABNORMAL LOW (ref 6.5–8.1)

## 2020-06-14 LAB — BODY FLUID CELL COUNT WITH DIFFERENTIAL
Lymphs, Fluid: 45 %
Monocyte-Macrophage-Serous Fluid: 39 % — ABNORMAL LOW (ref 50–90)
Neutrophil Count, Fluid: 16 % (ref 0–25)
Total Nucleated Cell Count, Fluid: 62 cu mm (ref 0–1000)

## 2020-06-14 LAB — AFP TUMOR MARKER: AFP, Serum, Tumor Marker: 5.1 ng/mL (ref 0.0–8.3)

## 2020-06-14 LAB — ALBUMIN, PLEURAL OR PERITONEAL FLUID: Albumin, Fluid: <1 g/dL

## 2020-06-14 LAB — PROTEIN, PLEURAL OR PERITONEAL FLUID: Total protein, fluid: <3 g/dL

## 2020-06-14 LAB — ANA W/REFLEX IF POSITIVE: Anti Nuclear Antibody (ANA): NEGATIVE

## 2020-06-14 MED ORDER — LIDOCAINE HCL 1 % IJ SOLN
INTRAMUSCULAR | Status: AC
  Filled 2020-06-14: qty 20

## 2020-06-14 NOTE — Plan of Care (Signed)
  Problem: Education: Goal: Knowledge of General Education information will improve Description Including pain rating scale, medication(s)/side effects and non-pharmacologic comfort measures Outcome: Progressing   Problem: Nutrition: Goal: Adequate nutrition will be maintained Outcome: Progressing   Problem: Elimination: Goal: Will not experience complications related to urinary retention Outcome: Progressing   Problem: Safety: Goal: Ability to remain free from injury will improve Outcome: Progressing   

## 2020-06-14 NOTE — TOC Initial Note (Signed)
Transition of Care [facility]) - Initial/Assessment Note    Patient Details  Name: Matthew [name] MRN: [id number] Date of Birth: [birth year]  Transition of Care [facility]) CM/SW Contact:    Lia Hopping, [place] Phone Number: 06/14/2020, 3:46 PM  Clinical Narrative:                 CSW met with the patient at bedside to provide SA resources and establish PCP. Patient declined SA resources. He reports, " I stopped on my own, the smell and taste of it just makes me sick now." Patient reports he recently moved to [place] from [place] and has been here only 7 weeks. He plans to get insurance through his job.CSW explain [facility] clinic. Patient agreeable to use in the interim while searching for a job. Patient prefers to make to his own follow up appointment at the [facility]. Information written on AVS.  No other needs identified.    Expected Discharge Plan: Home/Self Care Barriers to Discharge: No Barriers Identified   Patient Goals and CMS Choice     Choice offered to / list presented to : NA  Expected Discharge Plan and Services Expected Discharge Plan: Home/Self Care     Post Acute Care Choice: NA Living arrangements for the past 2 months: Apartment                 DME Arranged: N/A DME Agency: NA       HH Arranged: NA [place] Agency: NA        Prior Living Arrangements/Services Living arrangements for the past 2 months: Apartment Lives with:: Self Patient language and need for interpreter reviewed:: No Do you feel safe going back to the place where you live?: Yes      Need for Family Participation in Patient Care: No (Comment) Care giver support system in place?: No (comment)   Criminal Activity/Legal Involvement Pertinent to Current Situation/Hospitalization: No - Comment as needed  Activities of Daily Living Home Assistive Devices/Equipment: None ADL Screening (condition at time of admission) Patient's cognitive  ability adequate to safely complete daily activities?: Yes Is the patient deaf or have difficulty hearing?: No Does the patient have difficulty seeing, even when wearing glasses/contacts?: No Does the patient have difficulty concentrating, remembering, or making decisions?: No Patient able to express need for assistance with ADLs?: Yes Does the patient have difficulty dressing or bathing?: No Independently performs ADLs?: Yes (appropriate for developmental age) Does the patient have difficulty walking or climbing stairs?: No Weakness of Legs: None Weakness of Arms/Hands: None  Permission Sought/Granted Permission sought to share information with : Case Manager Permission granted to share information with : No              Emotional Assessment Appearance:: Appears stated age Attitude/Demeanor/Rapport: Guarded Affect (typically observed): Flat Orientation: : Oriented to Self, Oriented to Place, Oriented to  Time, Oriented to Situation Alcohol / Substance Use: Not Applicable Psych Involvement: No (comment)  Admission diagnosis:  Ascites of liver [R18.8] Acute liver failure without hepatic coma [K72.00] Patient Active Problem List   Diagnosis Date Noted  . Ascites of liver 06/12/2020  . Hyperbilirubinemia 06/12/2020  . Elevated AST (SGOT) 06/12/2020  . Hepatic cirrhosis ([place]) 06/12/2020  . Leukocytosis 06/12/2020  . Macrocytic anemia 06/12/2020  . Hypoalbuminemia 06/12/2020   PCP:  Patient, No Pcp Per Pharmacy:   [id number] [address]  [place] [address] Phone: [phone] Fax: [phone]     Social Determinants of Health (SDOH) Interventions    Readmission Risk Interventions No flowsheet data found.

## 2020-06-14 NOTE — Plan of Care (Signed)
  Problem: Education: Goal: Knowledge of General Education information will improve Description: Including pain rating scale, medication(s)/side effects and non-pharmacologic comfort measures Outcome: Progressing   Problem: Coping: Goal: Level of anxiety will decrease Outcome: Progressing   

## 2020-06-14 NOTE — Consult Note (Signed)
Referring Provider: Dr. Orland MustardAllison Wolfe Primary Care Physician:  Patient, No Pcp Per Primary Gastroenterologist:  Gentry FitzUnassigned  Reason for Consultation:  Cirrhosis  HPI: Matthew [name] is a 34 y.o. male with past medical history of alcohol use presenting for consultation of decompensated cirrhosis.  Patient reports worsening abdominal distention since the end of May.  He also had nausea and several episodes of hematemesis around May 23 to May 25th but denies any nausea or vomiting since that time.  He then decided to stop drinking, and has not had any more alcohol since May 25.  He reports diffuse abdominal pain related to distention.  He is also reporting heartburn and shortness of breath related to abdominal pressure from ascites.  He also reports lower extremity edema.    Patient reports history of diarrhea, which worsened after he stopped drinking alcohol abruptly.  He this has improved.  He denies any melena or hematochezia.  Denies GERD or dysphagia.  Has gained weight from edema; unaware of any recent weight loss.  Prior to May 25, patient had at least 4 "stiff" drinks (vodka) daily, sometimes more.  He drinks this quantity of alcohol for approximately 10 years.  Denies any aspirin, NSAID, tylenol, or blood thinner use.  He denies any family history of liver disease.  His mother had colon cancer, diagnosed at age 36.  She denies any other family history of gastrointestinal malignancies.  History reviewed. No pertinent past medical history.  History reviewed. No pertinent surgical history.  Prior to Admission medications   Medication Sig Start Date End Date Taking? Authorizing Provider  Multiple Vitamins-Minerals (ADULT ONE DAILY GUMMIES PO) Take 1 tablet by mouth every evening. CBD   Yes [provider]    Scheduled Meds:  furosemide  40 mg Oral BID   sodium chloride flush  3 mL Intravenous Once   sodium chloride flush  3 mL Intravenous Q12H   spironolactone  100 mg  Oral Daily   Continuous Infusions:  sodium chloride     cefTRIAXone (ROCEPHIN)  IV 2 g (06/13/20 2157)   PRN Meds:.sodium chloride, ibuprofen, ketorolac, sodium chloride flush  Allergies as of 06/12/2020   (No Known Allergies)    Family History  Problem Relation Age of Onset   Colon cancer Mother     Social History   Socioeconomic History   Marital status: Single    Spouse name: Not on file   Number of children: Not on file   Years of education: Not on file   Highest education level: Not on file  Occupational History   Occupation: not employed  Tobacco Use   Smoking status: Current Every Day Smoker    Packs/day: 0.25    Types: Cigarettes   Smokeless tobacco: Never Used  Substance and Sexual Activity   Alcohol use: Not Currently   Drug use: Never   Sexual activity: Not on file  Other Topics Concern   Not on file  Social History Narrative   Not on file   Social Determinants of Health   Financial Resource Strain:    Difficulty of Paying Living Expenses:   Food Insecurity:    Worried About Programme researcher, broadcasting/film/videounning Out of Food in the Last Year:    Baristaan Out of Food in the Last Year:   Transportation Needs:    Freight forwarderLack of Transportation (Medical):    Lack of Transportation (Non-Medical):   Physical Activity:    Days of Exercise per Week:    Minutes of Exercise per Session:  Stress:    Feeling of Stress :   Social Connections:    Frequency of Communication with Friends and Family:    Frequency of Social Gatherings with Friends and Family:    Attends Religious Services:    Active Member of Clubs or Organizations:    Attends Engineer, structural:    Marital Status:   Intimate Partner Violence:    Fear of Current or Ex-Partner:    Emotionally Abused:    Physically Abused:    Sexually Abused:     Review of Systems: Review of Systems  Constitutional: Positive for malaise/fatigue. Negative for chills and fever.  HENT: Negative for  hearing loss and tinnitus.   Eyes: Negative for pain and redness.  Respiratory: Positive for shortness of breath. Negative for cough.   Cardiovascular: Negative for chest pain and palpitations.  Gastrointestinal: Positive for abdominal pain, diarrhea and heartburn. Negative for blood in stool, constipation, melena, nausea and vomiting.  Genitourinary: Negative for flank pain and hematuria.  Musculoskeletal: Negative for falls and joint pain.  Skin: Negative for itching and rash.  Neurological: Negative for seizures and loss of consciousness.  Endo/Heme/Allergies: Negative for polydipsia. Does not bruise/bleed easily.  Psychiatric/Behavioral: Positive for substance abuse. The patient is not nervous/anxious.      Physical Exam: Vital signs: Vitals:   06/13/20 2238 06/14/20 0600  BP: 108/65 106/63  Pulse: 89 97  Resp: (!) 21   Temp: 99.3 F (37.4 C) 98.8 F (37.1 C)  SpO2: 96% 98%   Last BM Date: 06/13/20  Physical Exam Vitals reviewed.  Constitutional:      Appearance: He is well-developed.     Comments: thin  HENT:     Head: Normocephalic and atraumatic.     Nose: Nose normal.     Mouth/Throat:     Mouth: Mucous membranes are moist.     Pharynx: Oropharynx is clear.  Eyes:     General: Scleral icterus present.     Extraocular Movements: Extraocular movements intact.  Cardiovascular:     Rate and Rhythm: Regular rhythm. Tachycardia present.     Pulses: Normal pulses.     Heart sounds: Normal heart sounds.  Pulmonary:     Effort: Pulmonary effort is normal. No respiratory distress.     Breath sounds: Normal breath sounds.  Abdominal:     General: Bowel sounds are normal. There is distension.     Palpations: Abdomen is soft. There is no mass.     Tenderness: There is abdominal tenderness (moderate, diffuse). There is no guarding or rebound.     Hernia: No hernia is present.  Musculoskeletal:     Cervical back: Normal range of motion and neck supple.     Right lower  leg: Edema (2+ pitting pedal edema, 1+ lower extremity pitting edema) present.     Left lower leg: Edema (2+ pitting pedal edema, 1+ lower extremity pitting) present.  Skin:    General: Skin is warm and dry.     Coloration: Skin is jaundiced.  Neurological:     General: No focal deficit present.     Mental Status: He is oriented to person, place, and time. He is lethargic.  Psychiatric:        Mood and Affect: Mood normal.        Behavior: Behavior normal.     GI:  Lab Results: Recent Labs    06/12/20 2100 06/13/20 0701 06/14/20 0312  WBC 21.6* 20.9* 21.3*  HGB 9.5* 9.5* 9.4*  HCT 28.2* 28.4* 28.4*  PLT 204 198 176   BMET Recent Labs    06/12/20 1526 06/13/20 0701 06/14/20 0312  NA 133* 133* 130*  K 3.8 4.0 3.8  CL 101 100 99  CO2 23 23 22   GLUCOSE 135* 110* 114*  BUN 7 8 5*  CREATININE 0.80 0.75 0.55*  CALCIUM 7.6* 8.0* 7.5*   LFT Recent Labs    06/12/20 2100 06/13/20 0701 06/14/20 0312  PROT  --    < > 5.4*  ALBUMIN  --    < > 1.7*  AST  --    < > 77*  ALT  --    < > 29  ALKPHOS  --    < > 127*  BILITOT 9.0*   < > 7.1*  BILIDIR 3.6*  --   --   IBILI 5.4*  --   --    < > = values in this interval not displayed.   PT/INR Recent Labs    06/12/20 1626 06/13/20 0701  LABPROT 23.0* 23.7*  INR 2.1* 2.2*     Studies/Results: 08/14/20 Abdomen Complete  Result Date: 06/12/2020 CLINICAL DATA:  Abdominal pain elevated liver enzymes. EXAM: ABDOMEN ULTRASOUND COMPLETE COMPARISON:  Non FINDINGS: Gallbladder: Sludge with small stones in the gallbladder lumen. Gallbladder wall thickening to nearly 7 mm. No reported tenderness over the gallbladder. Diffuse ascites. Common bile duct: Diameter: 3.3 mm Liver: Heterogeneous echotexture with nodular hepatic contours. Small anechoic lesion 0.6 x 0.5 x 0.5 cm in the medial segment of the LEFT hepatic lobe. No additional lesions on limited assessment. Portal vein is patent on color Doppler imaging with normal direction of blood  flow towards the liver. IVC: Grossly normal within visualized portions. Pancreas: Pancreas not well seen along with midline structures. Spleen: Spleen is enlarged measuring approximately 15 cm greatest dimension. Right Kidney: Length: 11.1 cm. Limited assessment without hydronephrosis. Left Kidney: Length: 10.1 cm. Anechoic cyst in the interpolar LEFT kidney 0.7 x 0.7 x 1.2 cm Abdominal aorta: No aneurysm visualized. Distal aorta and iliacs not well seen obscured by bowel gas. Other findings: Large volume ascites. IMPRESSION: 1. Findings of hepatic cirrhosis and portal hypertension with ascites and splenomegaly. 2. Ascites and liver dysfunction gallbladder wall thickening is nonspecific, essentially equivocal for acute gallbladder pathology. There is also suggestion of some bowel edema which can be seen in the setting of portal hypertension. If there is continued concern for gallbladder pathology HIDA scan may be helpful. MRI and or MRCP is likely to be challenging in this patient with diffuse ascites and without signs of common bile duct dilation at this time. 3. Furthermore, imaging of the liver on follow-up with CT may be warranted given suggestion of advanced, chronic and decompensated liver disease. Electronically Signed   By: 08/13/2020 M.D.   On: 06/12/2020 18:19   CT ABDOMEN PELVIS W CONTRAST  Result Date: 06/13/2020 CLINICAL DATA:  Ascites with diffuse abdominal pain and nausea. EXAM: CT ABDOMEN AND PELVIS WITH CONTRAST TECHNIQUE: Multidetector CT imaging of the abdomen and pelvis was performed using the standard protocol following bolus administration of intravenous contrast. CONTRAST:  08/14/2020 OMNIPAQUE IOHEXOL 300 MG/ML  SOLN COMPARISON:  Abdominal ultrasound dated 06/12/2020. FINDINGS: Lower chest: Moderate bilateral pleural effusions with associated atelectasis are partially imaged. Hepatobiliary: The liver is heterogeneous with a nodular surface contour, consistent with hepatic cirrhosis. A 4 mm  hypoattenuating lesion in hepatic segment 4A likely represents a benign cyst which was seen on prior ultrasound. A gallstone  versus layering sludge is seen in the gallbladder neck. No gallbladder wall thickening or biliary dilatation. Pancreas: Unremarkable. No pancreatic ductal dilatation or surrounding inflammatory changes. Spleen: Enlarged without focal abnormality. Adrenals/Urinary Tract: Adrenal glands are unremarkable. Kidneys are normal, without renal calculi, focal lesion, or hydronephrosis. Bladder is nondistended. Stomach/Bowel: Stomach is within normal limits. Enteric contrast reaches the rectum. Appendix appears normal. No evidence of bowel wall thickening, distention, or inflammatory changes. Vascular/Lymphatic: The middle and left hepatic veins are not well seen which may be related to the phase of contrast. No enlarged abdominal or pelvic lymph nodes. Reproductive: Prostate is unremarkable. Other: No abdominal wall hernia or abnormality. Large volume ascites. Musculoskeletal: No acute or significant osseous findings. IMPRESSION: 1. The middle and left hepatic veins are not seen which may be related to the phase of contrast, however an abdominal ultrasound with liver Doppler could be considered to assess the hepatic veins (Budd-Chiari syndrome). 2. 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 3. Moderate bilateral pleural effusions with associated atelectasis. 4. Cholelithiasis versus layering sludge in the gallbladder neck. Electronically Signed   By: Romona Curls M.D.   On: 06/13/2020 13:53    Impression: Decompensated cirrhosis, most likely from alcohol use.  Hepatic discriminant function of 63 as of 06/13/20.   MELD Na of 26 as of 06/13/20. -PT 23.7/INR 2.2 on 7/5 -Today, T. Bili 7.1/ AST 77/ ALT 29/ ALP 127 (T. Bili 9.2 yesterday) -Acute hepatitis panel negative -Ferritin normal (333), iron normal (66), increased saturation (64%); do not suspect hemachromatosis -ANA, AMA, AFP  pending -Alpha-1 anti-trypsin and ceruloplasmin ordered  Leukocytosis: WBCs 20.9 today, 21.6 yesterday  Macrocytic anemia: Folate deficiency (5.6)  Plan: Diagnostic and therapeutic paracentesis today to further assess for SBP.  If SBP is not present, recommend initiation of prednisolone.  Continue spironolactone 100 mg and Lasix 40 mg.  Recommend outpatient EGD for variceal screening.  Patient declined inpatient EGD.  Screening colonoscopy at age 51 due to family history of colon cancer (mother, age 57)  Continue low-sodium diet.  [facility] will follow.   LOS: 2 days   Edrick Kins  PA-C 06/14/2020, 10:13 AM  Contact #  [phone]

## 2020-06-14 NOTE — Progress Notes (Signed)
PROGRESS NOTE    Matthew [name]  WCH:[id number] DOB: 03/19/1986 DOA: 06/12/2020 PCP: Patient, No Pcp Per   Brief Narrative:  Matthew [name] is a 34 y.o. male with medical history significant of alcoholism who stopped cold [place] in may of this year. He came in today for abdominal swelling and shortness of breath x one month. 10 year history of alcohol abuse. 5-8 drinks/day with vodka. Pt states May 23rd he threw up blood and on may 25th he quit drinking completely. During that week he states his stomach became significantly bloated and tender. He ate nothing but fresh veggies and fruits to see if this would fix it, but this did not help. He also became short of breath with dyspnea on exertion due to the stomach swelling.  His abdominal swelling and shortness of breath have stayed the same over the course of June. Today he states he has no pain if he just lays in bed, but his pain is 5-9/10 in his stomach, mainly RUQ and middle of stomach if he is walking or trying to get out of bed.  He states his feet are swollen. Denies yellow coloring to his eyes or skin. He denies any fever/chills. Denies palmar erythema or other skin rashes. Denies any nausea/vomiting and has not thrown up again since that time may. He does have very loose stools that are frequent. Denies any confusion or neurological deficits.  He does smoke cigarettes denies any other drug use. He denies any tylenol use. In ED: vitals stable on arrival and throughout ER visit. Labs significant for hyperbilirubinemia to 8.6, elevated AST to 85 and decreased albumin, protein and calcium. INR elevated to 2.1. CBC shows macrocytic anemia with hemoglobin of 9.5 and hematocrit of 28.5. marked leukocytosis to 18.7, no diff done. Liver ultrasound: remarkable for cirrhotic findings and portal HTN w/ ascites.   Assessment & Plan:   Principal Problem:   Hepatic cirrhosis (HCC) Active Problems:   Ascites of liver   Hyperbilirubinemia   Elevated  AST (SGOT)   Leukocytosis   Macrocytic anemia   Hypoalbuminemia  Acutely decompensated hepatic cirrhosis with portal hypertension and profound ascites SIRS criteria - cannot rule out SBP, POA - MELD score of 23 at admission MELD-Na score: 27 at 06/14/2020  3:12 AM MELD score: 23 at 06/14/2020  3:12 AM Calculated from: Serum Creatinine: 0.55 mg/dL (Using min of 1 mg/dL) at 06/14/2020  3:12 AM Serum Sodium: 130 mmol/L at 06/14/2020  3:12 AM Total Bilirubin: 7.1 mg/dL at 06/14/2020  3:12 AM INR(ratio): 2.2 at 06/13/2020  7:01 AM Age: 58 years - GI following, we appreciate insight and recommendations  - Repeat paracentesis ordered, follow cytology, labs, culture, cell count and differential  -Acute cirrhosis likely in the setting of profound alcohol history  - ANA (negative) AFP tumor marker(WNL); antimitochondrial antibodies, hepatitis remain pending. - Hepatitis panel nonreactive, EtOH level unremarkable, - Ceftriaxone to cover for possible SBP, patient does meet SIRS criteria (leukocytosis) - If SBP confirmed patient would certainly meet sepsis criteria present at admission - CT w/ contrast remarkable for cholelithiasis vs sludging; unable to evaluate middle/left hepatic veins - Fluid restrict/salt restricted diet - Follow I/Os - Follow cultures  Elevated AST/Bili - T-bili appears to be improving finally Hepatic Function Latest Ref Rng & Units 06/14/2020 06/13/2020 06/12/2020  Total Protein 6.5 - 8.1 g/dL 5.4(L) 5.8(L) -  Albumin 3.5 - 5.0 g/dL 1.7(L) 1.9(L) -  AST 15 - 41 U/L 77(H) 76(H) -  ALT 0 - 44 U/L 29  31 -  Alk Phosphatase 38 - 126 U/L 127(H) 126 -  Total Bilirubin 0.3 - 1.2 mg/dL 7.1(H) 9.2(H) 9.0(H)  Bilirubin, Direct 0.0 - 0.2 mg/dL - - 3.6(H)   Macrocytic anemia -likely secondary to #1 and alcohol abuse.  -Iron panel remarkable for low TIBC, B12 within normal limits, folate decreased -TSH elevated, T4 within normal limits, T3 low - would likely benefit from repeat outpatient labs  once acute process as above has resolved  Hypoalbuminemia -secondary to #1, raising concern for severe disease  DVT prophylaxis: SCDs   Code Status:   Full  Family Communication: None present  Status is: Inpatient  Dispo: The patient is from: Home              Anticipated d/c is to: Home              Anticipated d/c date is: 48 to 72 hours pending clinical course              Patient currently not medically stable for discharge due to ongoing need for further evaluation imaging and possible procedure with GI.  Consultants:   [facility], [facility]  Procedures:   Paracentesis 06/12/2020  Antimicrobials:  Ceftriaxone initiated 06/12/2020  Subjective: No acute issues or events overnight, abdominal pain nausea and diarrhea markedly improving denies headache, fevers, chills, constipation, shortness of breath, chest pain.  Objective: Vitals:   06/13/20 0950 06/13/20 1318 06/13/20 2238 06/14/20 0600  BP: 113/68 110/69 108/65 106/63  Pulse: 99 99 89 97  Resp: 17 18 (!) 21   Temp: 99 F (37.2 C) 98.5 F (36.9 C) 99.3 F (37.4 C) 98.8 F (37.1 C)  TempSrc:    Oral  SpO2: 97% 97% 96% 98%  Weight:      Height:        Intake/Output Summary (Last 24 hours) at 06/14/2020 0758 Last data filed at 06/14/2020 0600 Gross per 24 hour  Intake 460 ml  Output 0 ml  Net 460 ml   Filed Weights   06/12/20 2230 06/13/20 0500  Weight: 61.8 kg 62.4 kg    Examination:  General:  Pleasantly resting in bed, No acute distress. HEENT:  Normocephalic atraumatic.  Sclerae icteric, noninjected.  Extraocular movements intact bilaterally. Neck:  Without mass or deformity.  Trachea is midline. Lungs:  Clear to auscultate bilaterally without rhonchi, wheeze, or rales. Heart:  Regular rate and rhythm.  Without murmurs, rubs, or gallops. Abdomen:  Soft, diffusely tender, nondistended.  Without guarding or rebound. Extremities: Without cyanosis, clubbing, edema, or obvious deformity. Vascular:  Dorsalis pedis  and posterior tibial pulses palpable bilaterally. Skin:  Warm and dry, no erythema, no ulcerations, mild jaundice.  Data Reviewed: I have personally reviewed following labs and imaging studies  CBC: Recent Labs  Lab 06/12/20 1526 06/12/20 2100 06/13/20 0701 06/14/20 0312  WBC 18.7* 21.6* 20.9* 21.3*  NEUTROABS  --  18.5*  --   --   HGB 9.5* 9.5* 9.5* 9.4*  HCT 28.5* 28.2* 28.4* 28.4*  MCV 107.5* 105.6* 107.2* 106.4*  PLT 201 204 198 710   Basic Metabolic Panel: Recent Labs  Lab 06/12/20 1526 06/13/20 0701 06/14/20 0312  NA 133* 133* 130*  K 3.8 4.0 3.8  CL 101 100 99  CO2 _0 GLUCOSE 135* 110* 114*  BUN 7 8 5*  CREATININE 0.80 0.75 0.55*  CALCIUM 7.6* 8.0* 7.5*   GFR: Estimated Creatinine Clearance: 113.8 mL/min (A) (by C-G formula based on SCr of 0.55 mg/dL (  L)). Liver Function Tests: Recent Labs  Lab 06/12/20 1526 06/12/20 2100 06/13/20 0701 06/14/20 0312  AST 85*  --  76* 77*  ALT 32  --  31 29  ALKPHOS 137*  --  126 127*  BILITOT 8.6* 9.0* 9.2* 7.1*  PROT 5.9*  --  5.8* 5.4*  ALBUMIN 1.9*  --  1.9* 1.7*   Recent Labs  Lab 06/12/20 1526  LIPASE 46   No results for input(s): AMMONIA in the last 168 hours. Coagulation Profile: Recent Labs  Lab 06/12/20 1626 06/13/20 0701  INR 2.1* 2.2*   Cardiac Enzymes: No results for input(s): CKTOTAL, CKMB, CKMBINDEX, TROPONINI in the last 168 hours. BNP (last 3 results) No results for input(s): PROBNP in the last 8760 hours. HbA1C: Recent Labs    06/12/20 2100  HGBA1C 3.6*   CBG: No results for input(s): GLUCAP in the last 168 hours. Lipid Profile: No results for input(s): CHOL, HDL, LDLCALC, TRIG, CHOLHDL, LDLDIRECT in the last 72 hours. Thyroid Function Tests: Recent Labs    06/12/20 2100 06/13/20 0701  TSH 7.011*  --   FREET4  --  1.04   Anemia Panel: Recent Labs    06/12/20 2056 06/13/20 0701  VITAMINB12  --  1,394*  FOLATE  --  5.6*  FERRITIN 333  --   TIBC 74*  --   IRON 66   --    Sepsis Labs: No results for input(s): PROCALCITON, LATICACIDVEN in the last 168 hours.  Recent Results (from the past 240 hour(s))  SARS CORONAVIRUS 2 (TAT 6-24 HRS) Nasopharyngeal Nasopharyngeal Swab     Status: None   Collection Time: 06/12/20  9:00 PM   Specimen: Nasopharyngeal Swab  Result Value Ref Range Status   SARS Coronavirus 2 NEGATIVE NEGATIVE Final    Comment: (NOTE) SARS-CoV-2 target nucleic acids are NOT DETECTED.  The SARS-CoV-2 RNA is generally detectable in upper and lower respiratory specimens during the acute phase of infection. Negative results do not preclude SARS-CoV-2 infection, do not rule out co-infections with other pathogens, and should not be used as the sole basis for treatment or other patient management decisions. Negative results must be combined with clinical observations, patient history, and epidemiological information. The expected result is Negative.  Fact Sheet for Patients: SugarRoll.be  Fact Sheet for Healthcare Providers: https://www.woods-mathews.com/  This test is not yet approved or cleared by the [place] FDA and  has been authorized for detection and/or diagnosis of SARS-CoV-2 by FDA under an Emergency Use Authorization (EUA). This EUA will remain  in effect (meaning this test can be used) for the duration of the COVID-19 declaration under Se ction 564(b)(1) of the Act, 21 U.S.C. section 360bbb-3(b)(1), unless the authorization is terminated or revoked sooner.  Performed at [facility], [place] [address], [place], [place] [address]   Body fluid culture     Status: None (Preliminary result)   Collection Time: 06/12/20  9:59 PM   Specimen: Peritoneal Cavity; Peritoneal Fluid  Result Value Ref Range Status   Specimen Description PERITONEAL CAVITY FLUID  Final   Special Requests NONE  Final   Gram Stain   Final    RARE WBC PRESENT, PREDOMINANTLY MONONUCLEAR NO ORGANISMS  SEEN Performed at [facility], [place] [address] [place], [place] [address]    Culture PENDING  Incomplete   Report Status PENDING  Incomplete  Culture, blood (routine x 2)     Status: None (Preliminary result)   Collection Time: 06/13/20  7:01 AM   Specimen: BLOOD  Result Value Ref Range Status   Specimen Description   Final    BLOOD LEFT ANTECUBITAL Performed at [facility] [address], [place], [place] [address]    Special Requests   Final    BOTTLES DRAWN AEROBIC AND ANAEROBIC Blood Culture results may not be optimal due to an excessive volume of blood received in culture bottles Performed at [facility] [address], [place], [place] [address]    Culture   Final    NO GROWTH < 12 HOURS Performed at [facility] [address] [place], [place] [address]    Report Status PENDING  Incomplete  Culture, blood (routine x 2)     Status: None (Preliminary result)   Collection Time: 06/13/20  7:09 AM   Specimen: BLOOD  Result Value Ref Range Status   Specimen Description   Final    BLOOD BLOOD RIGHT HAND Performed at [facility] [address], [place], [place] [address]    Special Requests   Final    BOTTLES DRAWN AEROBIC AND ANAEROBIC Blood Culture adequate volume Performed at [facility] [address], [place], [place] [address]    Culture   Final    NO GROWTH < 12 HOURS Performed at [facility] [address], [place],  [address]    Report Status PENDING  Incomplete    Radiology Studies: US Abdomen Complete  Result Date: 06/12/2020 CLINICAL DATA:  Abdominal pain elevated liver enzymes. EXAM: ABDOMEN ULTRASOUND COMPLETE COMPARISON:  Non FINDINGS: Gallbladder: Sludge with small stones in the gallbladder lumen. Gallbladder wall thickening to nearly 7 mm. No reported tenderness over the gallbladder. Diffuse ascites. Common bile duct: Diameter: 3.3 mm Liver:  Heterogeneous echotexture with nodular hepatic contours. Small anechoic lesion 0.6 x 0.5 x 0.5 cm in the medial segment of the LEFT hepatic lobe. No additional lesions on limited assessment. Portal vein is patent on color Doppler imaging with normal direction of blood flow towards the liver. IVC: Grossly normal within visualized portions. Pancreas: Pancreas not well seen along with midline structures. Spleen: Spleen is enlarged measuring approximately 15 cm greatest dimension. Right Kidney: Length: 11.1 cm. Limited assessment without hydronephrosis. Left Kidney: Length: 10.1 cm. Anechoic cyst in the interpolar LEFT kidney 0.7 x 0.7 x 1.2 cm Abdominal aorta: No aneurysm visualized. Distal aorta and iliacs not well seen obscured by bowel gas. Other findings: Large volume ascites. IMPRESSION: 1. Findings of hepatic cirrhosis and portal hypertension with ascites and splenomegaly. 2. Ascites and liver dysfunction gallbladder wall thickening is nonspecific, essentially equivocal for acute gallbladder pathology. There is also suggestion of some bowel edema which can be seen in the setting of portal hypertension. If there is continued concern for gallbladder pathology HIDA scan may be helpful. MRI and or MRCP is likely to be challenging in this patient with diffuse ascites and without signs of common bile duct dilation at this time. 3. Furthermore, imaging of the liver on follow-up with CT may be warranted given suggestion of advanced, chronic and decompensated liver disease. Electronically Signed   By: Zetta Bills M.D.   On: 06/12/2020 18:19   CT ABDOMEN PELVIS W CONTRAST  Result Date: 06/13/2020 CLINICAL DATA:  Ascites with diffuse abdominal pain and nausea. EXAM: CT ABDOMEN AND PELVIS WITH CONTRAST TECHNIQUE: Multidetector CT imaging of the abdomen and pelvis was performed using the standard protocol following bolus administration of intravenous contrast.  CONTRAST:  167m OMNIPAQUE IOHEXOL 300 MG/ML  SOLN  COMPARISON:  Abdominal ultrasound dated 06/12/2020. FINDINGS: Lower chest: Moderate bilateral pleural effusions with associated atelectasis are partially imaged. Hepatobiliary: The liver is heterogeneous with a nodular surface contour, consistent with hepatic cirrhosis. A 4 mm hypoattenuating lesion in hepatic segment 4A likely represents a benign cyst which was seen on prior ultrasound. A gallstone versus layering sludge is seen in the gallbladder neck. No gallbladder wall thickening or biliary dilatation. Pancreas: Unremarkable. No pancreatic ductal dilatation or surrounding inflammatory changes. Spleen: Enlarged without focal abnormality. Adrenals/Urinary Tract: Adrenal glands are unremarkable. Kidneys are normal, without renal calculi, focal lesion, or hydronephrosis. Bladder is nondistended. Stomach/Bowel: Stomach is within normal limits. Enteric contrast reaches the rectum. Appendix appears normal. No evidence of bowel wall thickening, distention, or inflammatory changes. Vascular/Lymphatic: The middle and left hepatic veins are not well seen which may be related to the phase of contrast. No enlarged abdominal or pelvic lymph nodes. Reproductive: Prostate is unremarkable. Other: No abdominal wall hernia or abnormality. Large volume ascites. Musculoskeletal: No acute or significant osseous findings. IMPRESSION: 1. The middle and left hepatic veins are not seen which may be related to the phase of contrast, however an abdominal ultrasound with liver Doppler could be considered to assess the hepatic veins (Budd-Chiari syndrome). 2. 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 3. Moderate bilateral pleural effusions with associated atelectasis. 4. Cholelithiasis versus layering sludge in the gallbladder neck. Electronically Signed   By: TZerita BoersM.D.   On: 06/13/2020 13:53   Scheduled Meds: . furosemide  40 mg Oral BID  . sodium chloride flush  3 mL Intravenous Once  . sodium chloride flush  3  mL Intravenous Q12H  . spironolactone  100 mg Oral Daily   Continuous Infusions: . sodium chloride    . cefTRIAXone (ROCEPHIN)  IV 2 g (06/13/20 2157)     LOS: 2 days   Time spent: 40 min  WLittle Ishikawa DO [facility]  If 7PM-7AM, please contact night-coverage [email or web address]  06/14/2020, 7:58 AM

## 2020-06-14 NOTE — Procedures (Signed)
PROCEDURE SUMMARY:  Successful image-guided paracentesis from the left lower abdomen.  Yielded 3.8 liters of hazy yellow fluid.  No immediate complications.  EBL: zero Patient tolerated well.   Specimen was sent for labs.  Please see imaging section of Epic for full dictation.  Villa Herb PA-C 06/14/2020 2:34 PM

## 2020-06-15 LAB — CYTOLOGY - NON PAP

## 2020-06-15 LAB — MITOCHONDRIAL ANTIBODIES: Mitochondrial M2 Ab, IgG: <20 Units (ref 0.0–20.0)

## 2020-06-15 LAB — URINE CULTURE: Culture: <10000 — AB

## 2020-06-15 LAB — T3, FREE: T3, Free: 1.1 pg/mL — ABNORMAL LOW (ref 2.0–4.4)

## 2020-06-15 NOTE — Progress Notes (Signed)
Pt with request to leave AMA, [name] Right on floor coverage for [facility] notified as well as Games developer. AMA paper signed by Pt.

## 2020-06-15 NOTE — Discharge Summary (Signed)
DC SUMMARY - BRIEF    Matthew [name]  KJZ:[id number] DOB: June 06, 1986 DOA: 06/12/2020 PCP: Patient, No Pcp Per   Brief Narrative:  Matthew [name] is a 34 y.o. male with medical history significant of alcoholism who stopped cold [place] in may of this year. He came in today for abdominal swelling and shortness of breath x one month. 10 year history of alcohol abuse. 5-8 drinks/day with vodka. Pt states May 23rd he threw up blood and on may 25th he quit drinking completely. During that week he states his stomach became significantly bloated and tender. He ate nothing but fresh veggies and fruits to see if this would fix it, but this did not help. He also became short of breath with dyspnea on exertion due to the stomach swelling.  His abdominal swelling and shortness of breath have stayed the same over the course of June. Today he states he has no pain if he just lays in bed, but his pain is 5-9/10 in his stomach, mainly RUQ and middle of stomach if he is walking or trying to get out of bed.  He states his feet are swollen. Denies yellow coloring to his eyes or skin. He denies any fever/chills. Denies palmar erythema or other skin rashes. Denies any nausea/vomiting and has not thrown up again since that time may. He does have very loose stools that are frequent. Denies any confusion or neurological deficits.  He does smoke cigarettes denies any other drug use. He denies any tylenol use. In ED: vitals stable on arrival and throughout ER visit. Labs significant for hyperbilirubinemia to 8.6, elevated AST to 85 and decreased albumin, protein and calcium. INR elevated to 2.1. CBC shows macrocytic anemia with hemoglobin of 9.5 and hematocrit of 28.5. marked leukocytosis to 18.7, no diff done. Liver ultrasound: remarkable for cirrhotic findings and portal HTN w/ ascites.   Patient left AMA overnight/early morning of 05/16/20 despite night team explaining to patient his increased risk of worsening  decompensation and even death given his diagnosis below including but not limited to possible abdominal infection.    Assessment & Plan:   Principal Problem:   Hepatic cirrhosis (HCC) Active Problems:   Ascites of liver   Hyperbilirubinemia   Elevated AST (SGOT)   Leukocytosis   Macrocytic anemia   Hypoalbuminemia  Acutely decompensated hepatic cirrhosis with portal hypertension and profound ascites SIRS criteria - cannot rule out SBP, POA - MELD score of 23 at admission MELD-Na score: 27 at 06/14/2020  3:12 AM MELD score: 23 at 06/14/2020  3:12 AM Calculated from: Serum Creatinine: 0.55 mg/dL (Using min of 1 mg/dL) at 06/14/2020  3:12 AM Serum Sodium: 130 mmol/L at 06/14/2020  3:12 AM Total Bilirubin: 7.1 mg/dL at 06/14/2020  3:12 AM INR(ratio): 2.2 at 06/13/2020  7:01 AM Age: 30 years - GI following, we appreciate insight and recommendations  - Repeat paracentesis ordered, follow cytology, labs, culture, cell count and differential  -Acute cirrhosis likely in the setting of profound alcohol history  - ANA (negative) AFP tumor marker(WNL); antimitochondrial antibodies, hepatitis remain pending. - Hepatitis panel nonreactive, EtOH level unremarkable, - Ceftriaxone to cover for possible SBP, patient does meet SIRS criteria (leukocytosis) - If SBP confirmed patient would certainly meet sepsis criteria present at admission - CT w/ contrast remarkable for cholelithiasis vs sludging; unable to evaluate middle/left hepatic veins - Fluid restrict/salt restricted diet - Follow I/Os - Follow cultures  Elevated AST/Bili - T-bili appears to be improving finally Hepatic Function Latest Ref Rng &  Units 06/14/2020 06/13/2020 06/12/2020  Total Protein 6.5 - 8.1 g/dL 5.4(L) 5.8(L) -  Albumin 3.5 - 5.0 g/dL 1.7(L) 1.9(L) -  AST 15 - 41 U/L 77(H) 76(H) -  ALT 0 - 44 U/L 29 31 -  Alk Phosphatase 38 - 126 U/L 127(H) 126 -  Total Bilirubin 0.3 - 1.2 mg/dL 7.1(H) 9.2(H) 9.0(H)  Bilirubin, Direct 0.0 - 0.2  mg/dL - - 3.6(H)   Macrocytic anemia -likely secondary to #1 and alcohol abuse.  -Iron panel remarkable for low TIBC, B12 within normal limits, folate decreased -TSH elevated, T4 within normal limits, T3 low - would likely benefit from repeat outpatient labs once acute process as above has resolved  Hypoalbuminemia -secondary to #1, raising concern for severe disease   Little Ishikawa, DO   06/15/2020, 8:33 AM

## 2020-06-16 LAB — BODY FLUID CULTURE: Culture: NO GROWTH

## 2020-06-18 LAB — CULTURE, BLOOD (ROUTINE X 2)
Culture: NO GROWTH
Culture: NO GROWTH
Special Requests: ADEQUATE

## 2020-06-18 LAB — BODY FLUID CULTURE: Culture: NO GROWTH

## 2020-07-02 ENCOUNTER — Inpatient Hospital Stay (HOSPITAL_COMMUNITY)
Admission: EM | Admit: 2020-07-02 | Discharge: 2020-07-05 | DRG: 432 | Disposition: A | Discharge status: Home / Self Care | Payer: Self-pay | Attending: Family Medicine | Admitting: Family Medicine

## 2020-07-02 ENCOUNTER — Other Ambulatory Visit: Payer: Self-pay

## 2020-07-02 ENCOUNTER — Observation Stay (HOSPITAL_COMMUNITY): Payer: Self-pay

## 2020-07-02 ENCOUNTER — Encounter (HOSPITAL_COMMUNITY): Payer: Self-pay

## 2020-07-02 DIAGNOSIS — D72829 Elevated white blood cell count, unspecified: Secondary | ICD-10-CM

## 2020-07-02 DIAGNOSIS — D649 Anemia, unspecified: Secondary | ICD-10-CM | POA: Diagnosis present

## 2020-07-02 DIAGNOSIS — E876 Hypokalemia: Secondary | ICD-10-CM | POA: Diagnosis present

## 2020-07-02 DIAGNOSIS — K7200 Acute and subacute hepatic failure without coma: Secondary | ICD-10-CM | POA: Diagnosis present

## 2020-07-02 DIAGNOSIS — Z20822 Contact with and (suspected) exposure to covid-19: Secondary | ICD-10-CM | POA: Diagnosis present

## 2020-07-02 DIAGNOSIS — K766 Portal hypertension: Secondary | ICD-10-CM | POA: Diagnosis present

## 2020-07-02 DIAGNOSIS — D539 Nutritional anemia, unspecified: Secondary | ICD-10-CM | POA: Diagnosis present

## 2020-07-02 DIAGNOSIS — K7031 Alcoholic cirrhosis of liver with ascites: Principal | ICD-10-CM | POA: Diagnosis present

## 2020-07-02 DIAGNOSIS — Z800 Family history of malignant neoplasm of digestive organs: Secondary | ICD-10-CM

## 2020-07-02 DIAGNOSIS — R188 Other ascites: Secondary | ICD-10-CM

## 2020-07-02 DIAGNOSIS — Z681 Body mass index (BMI) 19 or less, adult: Secondary | ICD-10-CM

## 2020-07-02 DIAGNOSIS — F1021 Alcohol dependence, in remission: Secondary | ICD-10-CM | POA: Diagnosis present

## 2020-07-02 DIAGNOSIS — E43 Unspecified severe protein-calorie malnutrition: Secondary | ICD-10-CM | POA: Diagnosis present

## 2020-07-02 DIAGNOSIS — Z720 Tobacco use: Secondary | ICD-10-CM | POA: Diagnosis present

## 2020-07-02 DIAGNOSIS — K7290 Hepatic failure, unspecified without coma: Secondary | ICD-10-CM

## 2020-07-02 DIAGNOSIS — F17210 Nicotine dependence, cigarettes, uncomplicated: Secondary | ICD-10-CM | POA: Diagnosis present

## 2020-07-02 DIAGNOSIS — F1010 Alcohol abuse, uncomplicated: Secondary | ICD-10-CM

## 2020-07-02 DIAGNOSIS — K7460 Unspecified cirrhosis of liver: Secondary | ICD-10-CM | POA: Diagnosis present

## 2020-07-02 DIAGNOSIS — K7682 Hepatic encephalopathy: Secondary | ICD-10-CM | POA: Diagnosis present

## 2020-07-02 LAB — ETHANOL: Alcohol, Ethyl (B): <10 mg/dL (ref <10)

## 2020-07-02 LAB — COMPREHENSIVE METABOLIC PANEL
ALT: 29 U/L (ref 0–44)
AST: 66 U/L — ABNORMAL HIGH (ref 15–41)
Albumin: 2.2 g/dL — ABNORMAL LOW (ref 3.5–5.0)
Alkaline Phosphatase: 122 U/L (ref 38–126)
Anion gap: 14 (ref 5–15)
BUN: 6 mg/dL (ref 6–20)
CO2: 20 mmol/L — ABNORMAL LOW (ref 22–32)
Calcium: 8.5 mg/dL — ABNORMAL LOW (ref 8.9–10.3)
Chloride: 96 mmol/L — ABNORMAL LOW (ref 98–111)
Creatinine, Ser: 0.58 mg/dL — ABNORMAL LOW (ref 0.61–1.24)
GFR calc Af Amer: >60 mL/min (ref >60)
GFR calc non Af Amer: >60 mL/min (ref >60)
Glucose, Bld: 126 mg/dL — ABNORMAL HIGH (ref 70–99)
Potassium: 3.7 mmol/L (ref 3.5–5.1)
Sodium: 130 mmol/L — ABNORMAL LOW (ref 135–145)
Total Bilirubin: 8.6 mg/dL — ABNORMAL HIGH (ref 0.3–1.2)
Total Protein: 7 g/dL (ref 6.5–8.1)

## 2020-07-02 LAB — MAGNESIUM: Magnesium: 1.9 mg/dL (ref 1.7–2.4)

## 2020-07-02 LAB — CBC WITH DIFFERENTIAL/PLATELET
Abs Immature Granulocytes: 0.08 10*3/uL — ABNORMAL HIGH (ref 0.00–0.07)
Basophils Absolute: 0.1 10*3/uL (ref 0.0–0.1)
Basophils Relative: 1 %
Eosinophils Absolute: 0.3 10*3/uL (ref 0.0–0.5)
Eosinophils Relative: 2 %
HCT: 32.8 % — ABNORMAL LOW (ref 39.0–52.0)
Hemoglobin: 10.9 g/dL — ABNORMAL LOW (ref 13.0–17.0)
Immature Granulocytes: 1 %
Lymphocytes Relative: 10 %
Lymphs Abs: 1.6 10*3/uL (ref 0.7–4.0)
MCH: 34.3 pg — ABNORMAL HIGH (ref 26.0–34.0)
MCHC: 33.2 g/dL (ref 30.0–36.0)
MCV: 103.1 fL — ABNORMAL HIGH (ref 80.0–100.0)
Monocytes Absolute: 1.4 10*3/uL — ABNORMAL HIGH (ref 0.1–1.0)
Monocytes Relative: 9 %
Neutro Abs: 12.7 10*3/uL — ABNORMAL HIGH (ref 1.7–7.7)
Neutrophils Relative %: 77 %
Platelets: 180 10*3/uL (ref 150–400)
RBC: 3.18 MIL/uL — ABNORMAL LOW (ref 4.22–5.81)
RDW: 12.6 % (ref 11.5–15.5)
WBC: 16.2 10*3/uL — ABNORMAL HIGH (ref 4.0–10.5)
nRBC: 0 % (ref 0.0–0.2)

## 2020-07-02 LAB — SALICYLATE LEVEL: Salicylate Lvl: <7 mg/dL — ABNORMAL LOW (ref 7.0–30.0)

## 2020-07-02 LAB — AMMONIA: Ammonia: 77 umol/L — ABNORMAL HIGH (ref 9–35)

## 2020-07-02 LAB — SARS CORONAVIRUS 2 BY RT PCR (HOSPITAL ORDER, PERFORMED IN ~~LOC~~ HOSPITAL LAB): SARS Coronavirus 2: NEGATIVE

## 2020-07-02 LAB — PROTIME-INR
INR: 2.3 — ABNORMAL HIGH (ref 0.8–1.2)
Prothrombin Time: 24.8 seconds — ABNORMAL HIGH (ref 11.4–15.2)

## 2020-07-02 LAB — ACETAMINOPHEN LEVEL: Acetaminophen (Tylenol), Serum: <10 ug/mL — ABNORMAL LOW (ref 10–30)

## 2020-07-02 LAB — PHOSPHORUS: Phosphorus: 3.7 mg/dL (ref 2.5–4.6)

## 2020-07-02 MED ORDER — ALBUMIN HUMAN 25 % IV SOLN
12.5000 g | Freq: Once | INTRAVENOUS | Status: DC
  Filled 2020-07-02: qty 50

## 2020-07-02 MED ORDER — LACTULOSE 10 GM/15ML PO SOLN
30.0000 g | Freq: Once | ORAL | Status: AC
  Administered 2020-07-02: 30 g via ORAL
  Filled 2020-07-02: qty 60

## 2020-07-02 MED ORDER — LORAZEPAM 1 MG PO TABS
2.0000 mg | ORAL_TABLET | Freq: Once | ORAL | Status: AC
  Administered 2020-07-02: 2 mg via ORAL
  Filled 2020-07-02: qty 2

## 2020-07-02 MED ORDER — CHLORDIAZEPOXIDE HCL 25 MG PO CAPS
100.0000 mg | ORAL_CAPSULE | Freq: Once | ORAL | Status: AC
  Administered 2020-07-02: 100 mg via ORAL
  Filled 2020-07-02: qty 4

## 2020-07-02 NOTE — ED Notes (Signed)
Pt unable to provide urine sample.  He is sleeping at this time.  Will continue to check back.

## 2020-07-02 NOTE — ED Notes (Signed)
Pt given [place] sandwich and decaf cola.

## 2020-07-02 NOTE — ED Triage Notes (Signed)
Wife petitioned for IVC d/t pt. Drinking and not eating and utterly neglecting his ADL's. He arrives in no distress. He has been seen by Dr. Adela Lank.

## 2020-07-02 NOTE — ED Triage Notes (Signed)
He is quite alert and articulate. He tells me that he underwent paracentesis about a month ago at which time they obtained ~4 liters of ascites. He is in no distress. Other than his distended abd. He is cachectic. He tells me he is in no pain. He states his ex-wife is a Chiropractor", so she used IVC to compel him to come to hospital.

## 2020-07-02 NOTE — ED Provider Notes (Signed)
Matthew [name]   CSN: [id number] Arrival date & time: 07/02/20  1555     History Chief Complaint  Patient presents with  . Alcohol Problem    Matthew [name] is a 34 y.o. male.  34 yo M with a chief complaint of recurrent ascites.  Patient states that he is a recovering alcoholic and stopped drinking about a month ago.  He was in the hospital and saw GI and had a paracentesis.  He eventually went home and then had not yet followed up.  Started having recurrent swelling into was in the process of trying to find a physician to follow-up with.  States that his family was very upset that he has not followed up with someone yet.  Next thing he knew the police that showed up to take him to the hospital.  He denies suicidal or homicidal ideation.  Patient's IVC paperwork states that the patient has been continuing to drink every day has been confused and is starting to turn the heat on even though it is summertime.  They feel that he is unsafe to himself.  The history is provided by the patient and the police.  Illness Severity:  Moderate Onset quality:  Gradual Duration:  2 months Timing:  Intermittent Progression:  Waxing and waning Chronicity:  New Associated symptoms: abdominal pain (due to distention)   Associated symptoms: no chest pain, no congestion, no diarrhea, no fever, no headaches, no myalgias, no rash, no shortness of breath and no vomiting        No past medical history on file.  Patient Active Problem List   Diagnosis Date Noted  . Ascites of liver 06/12/2020  . Hyperbilirubinemia 06/12/2020  . Elevated AST (SGOT) 06/12/2020  . Hepatic cirrhosis (HCC) 06/12/2020  . Leukocytosis 06/12/2020  . Macrocytic anemia 06/12/2020  . Hypoalbuminemia 06/12/2020    No past surgical history on file.     Family History  Problem Relation Age of Onset  . Colon cancer Mother     Social History   Tobacco Use  . Smoking  status: Current Every Day Smoker    Packs/day: 0.25    Types: Cigarettes  . Smokeless tobacco: Never Used  Substance Use Topics  . Alcohol use: Yes  . Drug use: Never    Home Medications Prior to Admission medications   Medication Sig Start Date End Date Taking? Authorizing Provider  Multiple Vitamins-Minerals (ADULT ONE DAILY GUMMIES PO) Take 1 tablet by mouth every evening. CBD    [provider]    Allergies    Patient has no known allergies.  Review of Systems   Review of Systems  Constitutional: Negative for chills and fever.  HENT: Negative for congestion and facial swelling.   Eyes: Negative for discharge and visual disturbance.  Respiratory: Negative for shortness of breath.   Cardiovascular: Negative for chest pain and palpitations.  Gastrointestinal: Positive for abdominal pain (due to distention). Negative for diarrhea and vomiting.  Musculoskeletal: Negative for arthralgias and myalgias.  Skin: Negative for color change and rash.  Neurological: Negative for tremors, syncope and headaches.  Psychiatric/Behavioral: Negative for confusion and dysphoric mood.    Physical Exam Updated Vital Signs BP 123/82 (BP Location: Right Arm)   Pulse (!) 110   Temp 98.8 F (37.1 C) (Oral)   Resp 18   SpO2 100%   Physical Exam Vitals and nursing [name] reviewed.  Constitutional:      Appearance: He is well-developed. He is  ill-appearing (chronically so).  HENT:     Head: Normocephalic and atraumatic.  Eyes:     General: Scleral icterus present.     Pupils: Pupils are equal, round, and reactive to light.  Neck:     Vascular: No JVD.  Cardiovascular:     Rate and Rhythm: Normal rate and regular rhythm.     Heart sounds: No murmur heard.  No friction rub. No gallop.   Pulmonary:     Effort: No respiratory distress.     Breath sounds: No wheezing.  Abdominal:     General: There is distension.     Tenderness: There is abdominal tenderness. There is no guarding  or rebound.     Comments: Distended, mild tenderness diffusely  Musculoskeletal:        General: Normal range of motion.     Cervical back: Normal range of motion and neck supple.     Right lower leg: Edema (1+ to BLE) present.     Left lower leg: Edema present.  Skin:    Coloration: Skin is not pale.     Findings: No rash.  Neurological:     Mental Status: He is alert. He is confused.     GCS: GCS verbal subscore is 4.  Psychiatric:        Behavior: Behavior normal.     ED Results / Procedures / Treatments   Labs (all labs ordered are listed, but only abnormal results are displayed) Labs Reviewed  CBC WITH DIFFERENTIAL/PLATELET - Abnormal; Notable for the following components:      Result Value   WBC 16.2 (*)    RBC 3.18 (*)    Hemoglobin 10.9 (*)    HCT 32.8 (*)    MCV 103.1 (*)    MCH 34.3 (*)    Neutro Abs 12.7 (*)    Monocytes Absolute 1.4 (*)    Abs Immature Granulocytes 0.08 (*)    All other components within normal limits  COMPREHENSIVE METABOLIC PANEL - Abnormal; Notable for the following components:   Sodium 130 (*)    Chloride 96 (*)    CO2 20 (*)    Glucose, Bld 126 (*)    Creatinine, Ser 0.58 (*)    Calcium 8.5 (*)    Albumin 2.2 (*)    AST 66 (*)    Total Bilirubin 8.6 (*)    All other components within normal limits  PROTIME-INR - Abnormal; Notable for the following components:   Prothrombin Time 24.8 (*)    INR 2.3 (*)    All other components within normal limits  AMMONIA - Abnormal; Notable for the following components:   Ammonia 77 (*)    All other components within normal limits  SALICYLATE LEVEL - Abnormal; Notable for the following components:   Salicylate Lvl <7.0 (*)    All other components within normal limits  ACETAMINOPHEN LEVEL - Abnormal; Notable for the following components:   Acetaminophen (Tylenol), Serum <10 (*)    All other components within normal limits  SARS CORONAVIRUS 2 BY RT PCR (HOSPITAL ORDER, PERFORMED IN [facility])  ETHANOL    EKG None  Radiology No results found.  Procedures Procedures (including critical care time)  Medications Ordered in ED Medications  lactulose (CHRONULAC) 10 GM/15ML solution 30 g (has no administration in time range)  LORazepam (ATIVAN) tablet 2 mg (2 mg Oral Given 07/02/20 1702)  chlordiazePOXIDE (LIBRIUM) capsule 100 mg (100 mg Oral Given 07/02/20 1702)  ED Course  I have reviewed the triage vital signs and the nursing notes.  Pertinent labs & imaging results that were available during my care of the patient were reviewed by me and considered in my medical decision making (see chart for details).    MDM Rules/Calculators/A&P                          34 yo M with a chief complaints of recurrent abdominal distention.  Patient was sent here under involuntary commitment paperwork.  Family is concerned that he has been increasingly confused and has been acting erratically.  They feel that he is continuing to drink every day.  Patient however states that he has not been drinking for the past month since he quit.  He is looking to follow-up as an outpatient.  Will obtain lab work.  Reassess.  Patient's INR is slightly higher than it was when he was in the hospital.  LFT elevation has resolved with the exception of his total bilirubin.  Discussed the results with the patient.  I feel the patient has been significantly confused enough to have his family IVC him that he likely needs to stay in the hospital for hepatic encephalopathy.  Will start on lactulose here in the ED.  Discussed with the hospitalist.  The patients results and plan were reviewed and discussed.   Any x-rays performed were independently reviewed by myself.   Differential diagnosis were considered with the presenting HPI.  Medications  lactulose (CHRONULAC) 10 GM/15ML solution 30 g (has no administration in time range)  LORazepam (ATIVAN) tablet 2 mg (2 mg Oral Given 07/02/20 1702)    chlordiazePOXIDE (LIBRIUM) capsule 100 mg (100 mg Oral Given 07/02/20 1702)    Vitals:   07/02/20 1644  BP: 123/82  Pulse: (!) 110  Resp: 18  Temp: 98.8 F (37.1 C)  TempSrc: Oral  SpO2: 100%    Final diagnoses:  Hepatic encephalopathy (HCC)    Admission/ observation were discussed with the admitting physician, patient and/or family and they are comfortable with the plan.   Final Clinical Impression(s) / ED Diagnoses Final diagnoses:  Hepatic encephalopathy [facility])    Rx / DC Orders ED Discharge Orders    None       Melene Plan, DO 07/02/20 1915

## 2020-07-02 NOTE — ED Notes (Signed)
Pt aware urine sample needed.  Provided pt with urinal and he stated he would attempt to provide sample. Will check back.

## 2020-07-02 NOTE — H&P (Signed)
Matthew [name] OFB:[id number] DOB: 1986-10-23 DOA: 07/02/2020     PCP: Patient, No Pcp Per   Outpatient Specialists:     GI unsure who he have seen    Patient arrived to ER on 07/02/20 at 1555 Referred by Attending Melene Plan, DO   Patient coming from: home Lives With family    Chief Complaint:   Chief Complaint  Patient presents with  . Alcohol Problem    HPI: Matthew [name] is a 34 y.o. male with medical history significant of ETOH abuse for 10 years, Liver failure     Presented with worsening confusion family filed IVC paperwork for patient being unsafe at home  Recently was admitted for ascites requiring paracentesis.  Patient states he quit drinking in May 2021 he continues to smoke but does not use any other drugs denies Tylenol use  Patient at baseline has total bili around 8 and hemoglobin around 9.5 He had an admission 2 weeks ago during that admission his ultrasound showed ascites and hepatic cirrhosis with likely advanced chronic and decompensated liver failure 2 weeks ago meld score was 23 He undergone further work-up for the cause of his liver failure including ANA antimitochondrial antibodies hepatitis and AFP tumor marker hepatitis panel was nonreactive During his prior admission he was initially treated with ceftriaxone to cover possible SBP secondary to leukocytosis and a CT scan of abdomen done that showed gallbladder cholelithiasis  After patient was discharged on 9 July he came back complain recurrent ascites he could not find a primary care provider to follow-up with his family was very upset with him that he has not found a way to follow-up with the culprit reason he was IVC needed brought to emergency department he at this time denies any suicidal homicidal ideation his family did state that he continues to drink and that he has been more confused and family thought that he was unsafe at home because he was trying to turn on the heat in the  middle of summer  Patient denies any of this stating that his wife is a Product/process development scientist and that is why he is here  Patient reports his last Paracenthesis was on 06/14/20 and 4L  Was taken out.   Infectious risk factors:  Reports  shortness of breath     Has  NOt been vaccinated against COVID    Initial COVID TEST  NEGATIVE   Lab Results  Component Value Date   SARSCOV2NAA NEGATIVE 07/02/2020   SARSCOV2NAA NEGATIVE 06/12/2020    Regarding pertinent Chronic problems:       Hypothyroidism: ??  Last admission elevated TSH we will need to repeat Lab Results  Component Value Date   TSH 7.011 (H) 06/12/2020      Liver disease MELD-Na score: 28 up from 23 2 weeks ago   Chronic anemia - baseline hg Hemoglobin & Hematocrit  Recent Labs    06/13/20 0701 06/14/20 0312 07/02/20 1708  HGB 9.5* 9.4* 10.9*    While in ER: Ammonia level was found to be 77. He has maintained to have elevated white blood cell count 16.2 which is somewhat down from before Albumin 2.2 total bili 8.6 which is about stable INR 2.3 close to his baseline  Patient was given a dose of lactulose Ativan and Librium Hospitalist was called for admission for hepatic encephalopathy  decompensated liver.  The following Work up has been ordered so far:  Orders Placed This Encounter  Procedures  . SARS Coronavirus 2 by RT  PCR (hospital order, performed in [facility] hospital lab) Nasopharyngeal Nasopharyngeal Swab  . CBC with Differential  . Comprehensive metabolic panel  . Protime-INR  . Ammonia  . Ethanol  . Salicylate level  . Acetaminophen level  . Consult to hospitalist  ALL PATIENTS BEING ADMITTED/HAVING PROCEDURES NEED COVID-19 SCREENING    Following Medications were ordered in ER: Medications  lactulose (CHRONULAC) 10 GM/15ML solution 30 g (has no administration in time range)  LORazepam (ATIVAN) tablet 2 mg (2 mg Oral Given 07/02/20 1702)  chlordiazePOXIDE (LIBRIUM) capsule 100 mg (100 mg Oral Given 07/02/20  1702)        Consult Orders  (From admission, onward)         Start     Ordered   07/02/20 1838  Consult to hospitalist  ALL PATIENTS BEING ADMITTED/HAVING PROCEDURES NEED COVID-19 SCREENING  Once       Comments: ALL PATIENTS BEING ADMITTED/HAVING PROCEDURES NEED COVID-19 SCREENING  Provider:  (Not yet assigned)  Question Answer Comment  Place call to: [facility]   Reason for Consult Admit      07/02/20 1837          Significant initial  Findings: Abnormal Labs Reviewed  CBC WITH DIFFERENTIAL/PLATELET - Abnormal; Notable for the following components:      Result Value   WBC 16.2 (*)    RBC 3.18 (*)    Hemoglobin 10.9 (*)    HCT 32.8 (*)    MCV 103.1 (*)    MCH 34.3 (*)    Neutro Abs 12.7 (*)    Monocytes Absolute 1.4 (*)    Abs Immature Granulocytes 0.08 (*)    All other components within normal limits  COMPREHENSIVE METABOLIC PANEL - Abnormal; Notable for the following components:   Sodium 130 (*)    Chloride 96 (*)    CO2 20 (*)    Glucose, Bld 126 (*)    Creatinine, Ser 0.58 (*)    Calcium 8.5 (*)    Albumin 2.2 (*)    AST 66 (*)    Total Bilirubin 8.6 (*)    All other components within normal limits  PROTIME-INR - Abnormal; Notable for the following components:   Prothrombin Time 24.8 (*)    INR 2.3 (*)    All other components within normal limits  AMMONIA - Abnormal; Notable for the following components:   Ammonia 77 (*)    All other components within normal limits  SALICYLATE LEVEL - Abnormal; Notable for the following components:   Salicylate Lvl <7.0 (*)    All other components within normal limits  ACETAMINOPHEN LEVEL - Abnormal; Notable for the following components:   Acetaminophen (Tylenol), Serum <10 (*)    All other components within normal limits    Otherwise labs showing:    Recent Labs  Lab 07/02/20 1708  NA 130*  K 3.7  CO2 20*  GLUCOSE 126*  BUN 6  CREATININE 0.58*  CALCIUM 8.5*    Cr   stable,    Lab Results    Component Value Date   CREATININE 0.58 (L) 07/02/2020   CREATININE 0.55 (L) 06/14/2020   CREATININE 0.75 06/13/2020    Recent Labs  Lab 07/02/20 1708  AST 66*  ALT 29  ALKPHOS 122  BILITOT 8.6*  PROT 7.0  ALBUMIN 2.2*   Lab Results  Component Value Date   CALCIUM 8.5 (L) 07/02/2020     WBC      Component Value Date/Time   WBC  16.2 (H) 07/02/2020 1708   ANC    Component Value Date/Time   NEUTROABS 12.7 (H) 07/02/2020 1708   ALC No components found for: LYMPHAB    Plt: Lab Results  Component Value Date   PLT 180 07/02/2020     COVID-19 Labs  No results for input(s): DDIMER, FERRITIN, LDH, CRP in the last 72 hours.  Lab Results  Component Value Date   SARSCOV2NAA NEGATIVE 06/12/2020    No results for input(s): LIPASE, AMYLASE in the last 168 hours. Recent Labs  Lab 07/02/20 1708  AMMONIA 77*     ECG: Ordered Personally reviewed by me showing: HR : 112 Rhythm: Sinus tachycardia    no evidence of ischemic changes QTC 466   DM  labs:  HbA1C: Recent Labs    06/12/20 2100  HGBA1C 3.6*     UA ordered    Ordered   CXR -atelectasis      ED Triage Vitals  Enc Vitals Group     BP 07/02/20 1644 123/82     Pulse Rate 07/02/20 1644 (!) 110     Resp 07/02/20 1644 18     Temp 07/02/20 1644 98.8 F (37.1 C)     Temp Source 07/02/20 1644 Oral     SpO2 07/02/20 1644 100 %     Weight --      Height --      Head Circumference --      Peak Flow --      Pain Score 07/02/20 1615 0     Pain Loc --      Pain Edu? --      Excl. in GC? --   TMAX(24)@       Latest  Blood pressure 123/82, pulse (!) 110, temperature 98.8 F (37.1 C), temperature source Oral, resp. rate 18, SpO2 100 %.      Review of Systems:    Pertinent positives include:  shortness of breath at rest, abdominal distention  Constitutional:  No weight loss, night sweats, Fevers, chills, fatigue, weight loss  HEENT:  No headaches, Difficulty swallowing,Tooth/dental problems,Sore  throat,  No sneezing, itching, ear ache, nasal congestion, post nasal drip,  Cardio-vascular:  No chest pain, Orthopnea, PND, anasarca, dizziness, palpitations.no Bilateral lower extremity swelling  GI:  No heartburn, indigestion, abdominal pain, nausea, vomiting, diarrhea, change in bowel habits, loss of appetite, melena, blood in stool, hematemesis Resp:  no . No dyspnea on exertion, No excess mucus, no productive cough, No non-productive cough, No coughing up of blood.No change in color of mucus.No wheezing. Skin:  no rash or lesions. No jaundice GU:  no dysuria, change in color of urine, no urgency or frequency. No straining to urinate.  No flank pain.  Musculoskeletal:  No joint pain or no joint swelling. No decreased range of motion. No back pain.  Psych:  No change in mood or affect. No depression or anxiety. No memory loss.  Neuro: no localizing neurological complaints, no tingling, no weakness, no double vision, no gait abnormality, no slurred speech, no confusion  All systems reviewed and apart from HOPI all are negative  Past Medical History:  No past medical history on file.    No past surgical history on file.  Social History:  Ambulatory   Independently      reports that he has been smoking cigarettes. He has been smoking about 0.25 packs per day. He has never used smokeless tobacco. He reports current alcohol use. He reports that he does not  use drugs.      Family History:   Family History  Problem Relation Age of Onset  . Colon cancer Mother    Alcohol abuse Allergies: No Known Allergies   Prior to Admission medications   Medication Sig Start Date End Date Taking? Authorizing Provider  Multiple Vitamins-Minerals (ADULT ONE DAILY GUMMIES PO) Take 1 tablet by mouth every evening. CBD    [provider]   Physical Exam: Vitals with BMI 07/02/2020 06/14/2020 06/14/2020  Height - - -  Weight - - -  BMI - - -  Systolic 123 102 409  Diastolic 82 65  67  Pulse 110 84 83    1. General:  in No Acute distress   -appearing 2. Psychological: Alert and  Oriented 3. Head/ENT:    Dry Mucous Membranes                          Head Non traumatic, neck supple                          Poor Dentition 4. SKIN:   decreased Skin turgor,  Skin clean Dry and intact no rash 5. Heart: Regular rate and rhythm no  Murmur, no Rub or gallop 6. Lungs: , no wheezes or crackles   7. Abdomen: Soft, non-tender, distended  Obese bowel sounds present 8. Lower extremities: no clubbing, cyanosis, 1+ edema 9. Neurologically Grossly intact, moving all 4 extremities equally  - no asterixis  10. MSK: Normal range of motion   All other LABS:     Recent Labs  Lab 07/02/20 1708  WBC 16.2*  NEUTROABS 12.7*  HGB 10.9*  HCT 32.8*  MCV 103.1*  PLT 180     Recent Labs  Lab 07/02/20 1708  NA 130*  K 3.7  CL 96*  CO2 20*  GLUCOSE 126*  BUN 6  CREATININE 0.58*  CALCIUM 8.5*     Recent Labs  Lab 07/02/20 1708  AST 66*  ALT 29  ALKPHOS 122  BILITOT 8.6*  PROT 7.0  ALBUMIN 2.2*       Cultures:    Component Value Date/Time   SDES  06/14/2020 1500    CYTO PERI Performed at [facility], [address], [place], [place] [address]    SPECREQUEST  06/14/2020 1500    NONE Performed at [facility], [address] [place], [place] [address]    CULT  06/14/2020 1500    NO GROWTH Performed at [facility], [address] [place], [place] [address]    REPTSTATUS 06/18/2020 FINAL 06/14/2020 1500     Radiological Exams on Admission: DG CHEST PORT 1 VIEW  Result Date: 07/02/2020 CLINICAL DATA:  Hepatic encephalopathy EXAM: PORTABLE CHEST 1 VIEW COMPARISON:  None FINDINGS: Low lung volumes and streaky basilar atelectasis. No consolidation, features of edema, pneumothorax, or effusion. Pulmonary vascularity is normally distributed. The cardiomediastinal contours are unremarkable. No acute osseous or soft  tissue abnormality. IMPRESSION: Low volumes and atelectasis without other acute cardiopulmonary abnormality. Electronically Signed   By: Kreg Shropshire M.D.   On: 07/02/2020 20:02    Chart has been reviewed    Assessment/Plan  34 y.o. male with medical history significant of ETOH abuse for 10 years, Liver failure  Admitted for hepatic encephalopathy  Present on Admission: . Hepatic encephalopathy (HCC) -lactulose and titrate up as needed to at least 2-3 bowel  movements per day.  Continue to follow ammonia level  . Ascites of liver -  need repeat paracentesis, US guided ordered, labs ordred give IV albumin   . 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  Meld score up to 28.  Patient overall has very poor prognosis.  Will need to follow-up with GI.  Notify GI has been admitted   . Leukocytosis chronic recurrent no evidence of infection   . Macrocytic anemia felt to be most likely secondary to alcohol abuse continue to monitor at this point indication for transfusion   . ETOH abuse-patient was stating that he is currently in remission alcohol level negative monitor for any signs of withdrawal and placed on CIWA family stating that he still continues to drink patient strongly denies alcohol level negative     . Tobacco abuse -  - Spoke about importance of quitting spent 5 minutes discussing options for treatment, prior attempts at quitting, and dangers of smoking  -At this point patient is     interested in quitting  - order nicotine patch   - nursing tobacco cessation protocol   Other plan as per orders.  DVT prophylaxis:  SCD      Code Status:    Code Status: Prior FULL CODE  as per patient   I had personally discussed CODE STATUS with patient  Poor prognosis overall but patient is very interested in getting better  Family Communication:   Family not at  Bedside    Disposition Plan:     To home once workup is complete and patient is stable   Following barriers for  discharge:                            Electrolytes corrected                                                           Pain controlled with PO medications                                white count improving                               Will need to be able to tolerate PO                            Will likely need home health, home O2, set up                           Will need consultants to evaluate patient prior to discharge                        Would benefit from PT/OT eval prior to DC  Ordered                                      Consults called: made  Gi aware  thorugh epic pt has been admitted  Admission  status:  ED Disposition    ED Disposition Condition Comment   Admit  Hospital [facility]: [facility] [[id number]]  Level of Care: Telemetry [5]  Admit to tele based on following criteria: Other see comments  Comments: liver failure  Covid Evaluation: Asymptomatic Screening Protocol (No Symptoms)  Diagnosis: Hepatic encephalopathy (HCC) [572.2.ICD-9-CM]  Admitting Physician: Therisa Doyne [[id number]]  Attending Physician: Therisa Doyne [[id number]]       Obs    Level of care    tele  For  24H       Lab Results  Component Value Date   SARSCOV2NAA NEGATIVE 06/12/2020     Precautions: admitted as  Covid Negative   PPE: Used by the provider:   N95  eye Goggles,  Gloves     Javia Dillow 07/02/2020, 9:05 PM    [facility]     after 2 AM please page floor coverage PA If 7AM-7PM, please contact the day team taking care of the patient using [email or web address]   Patient was evaluated in the context of the global COVID-19 pandemic, which necessitated consideration that the patient might be at risk for infection with the SARS-CoV-2 virus that causes COVID-19. Institutional protocols and algorithms that pertain to the evaluation of patients at risk for COVID-19 are in a state of rapid change based on information released by regulatory bodies including  the CDC and federal and state organizations. These policies and algorithms were followed during the patient's care.

## 2020-07-03 ENCOUNTER — Observation Stay (HOSPITAL_COMMUNITY): Payer: Self-pay

## 2020-07-03 ENCOUNTER — Other Ambulatory Visit: Payer: Self-pay

## 2020-07-03 ENCOUNTER — Other Ambulatory Visit (HOSPITAL_COMMUNITY): Payer: Self-pay

## 2020-07-03 LAB — COMPREHENSIVE METABOLIC PANEL
ALT: 24 U/L (ref 0–44)
AST: 56 U/L — ABNORMAL HIGH (ref 15–41)
Albumin: 2 g/dL — ABNORMAL LOW (ref 3.5–5.0)
Alkaline Phosphatase: 103 U/L (ref 38–126)
Anion gap: 9 (ref 5–15)
BUN: 6 mg/dL (ref 6–20)
CO2: 25 mmol/L (ref 22–32)
Calcium: 7.8 mg/dL — ABNORMAL LOW (ref 8.9–10.3)
Chloride: 97 mmol/L — ABNORMAL LOW (ref 98–111)
Creatinine, Ser: 0.58 mg/dL — ABNORMAL LOW (ref 0.61–1.24)
GFR calc Af Amer: >60 mL/min (ref >60)
GFR calc non Af Amer: >60 mL/min (ref >60)
Glucose, Bld: 124 mg/dL — ABNORMAL HIGH (ref 70–99)
Potassium: 3.5 mmol/L (ref 3.5–5.1)
Sodium: 131 mmol/L — ABNORMAL LOW (ref 135–145)
Total Bilirubin: 8 mg/dL — ABNORMAL HIGH (ref 0.3–1.2)
Total Protein: 6 g/dL — ABNORMAL LOW (ref 6.5–8.1)

## 2020-07-03 LAB — PROTIME-INR
INR: 2.9 — ABNORMAL HIGH (ref 0.8–1.2)
Prothrombin Time: 29.4 seconds — ABNORMAL HIGH (ref 11.4–15.2)

## 2020-07-03 LAB — URINALYSIS, ROUTINE W REFLEX MICROSCOPIC
Bacteria, UA: NONE SEEN
Glucose, UA: 50 mg/dL — AB
Hgb urine dipstick: NEGATIVE
Ketones, ur: 5 mg/dL — AB
Leukocytes,Ua: NEGATIVE
Nitrite: NEGATIVE
Protein, ur: 30 mg/dL — AB
Specific Gravity, Urine: 1.029 (ref 1.005–1.030)
pH: 5 (ref 5.0–8.0)

## 2020-07-03 LAB — CBG MONITORING, ED
Glucose-Capillary: 150 mg/dL — ABNORMAL HIGH (ref 70–99)
Glucose-Capillary: 40 mg/dL — CL (ref 70–99)

## 2020-07-03 LAB — CBC WITH DIFFERENTIAL/PLATELET
Abs Immature Granulocytes: 0.07 10*3/uL (ref 0.00–0.07)
Basophils Absolute: 0.1 10*3/uL (ref 0.0–0.1)
Basophils Relative: 1 %
Eosinophils Absolute: 0.2 10*3/uL (ref 0.0–0.5)
Eosinophils Relative: 2 %
HCT: 29.1 % — ABNORMAL LOW (ref 39.0–52.0)
Hemoglobin: 9.9 g/dL — ABNORMAL LOW (ref 13.0–17.0)
Immature Granulocytes: 1 %
Lymphocytes Relative: 14 %
Lymphs Abs: 1.7 10*3/uL (ref 0.7–4.0)
MCH: 35.1 pg — ABNORMAL HIGH (ref 26.0–34.0)
MCHC: 34 g/dL (ref 30.0–36.0)
MCV: 103.2 fL — ABNORMAL HIGH (ref 80.0–100.0)
Monocytes Absolute: 1.4 10*3/uL — ABNORMAL HIGH (ref 0.1–1.0)
Monocytes Relative: 11 %
Neutro Abs: 9.3 10*3/uL — ABNORMAL HIGH (ref 1.7–7.7)
Neutrophils Relative %: 71 %
Platelets: 147 10*3/uL — ABNORMAL LOW (ref 150–400)
RBC: 2.82 MIL/uL — ABNORMAL LOW (ref 4.22–5.81)
RDW: 12.6 % (ref 11.5–15.5)
WBC: 12.8 10*3/uL — ABNORMAL HIGH (ref 4.0–10.5)
nRBC: 0 % (ref 0.0–0.2)

## 2020-07-03 LAB — BODY FLUID CELL COUNT WITH DIFFERENTIAL
Eos, Fluid: 0 %
Lymphs, Fluid: 24 %
Monocyte-Macrophage-Serous Fluid: 58 % (ref 50–90)
Neutrophil Count, Fluid: 18 % (ref 0–25)
Total Nucleated Cell Count, Fluid: 64 cu mm (ref 0–1000)

## 2020-07-03 LAB — TSH: TSH: 4.042 u[IU]/mL (ref 0.350–4.500)

## 2020-07-03 LAB — LACTATE DEHYDROGENASE, PLEURAL OR PERITONEAL FLUID: LD, Fluid: 24 U/L — ABNORMAL HIGH (ref 3–23)

## 2020-07-03 LAB — ALBUMIN, PLEURAL OR PERITONEAL FLUID: Albumin, Fluid: <1 g/dL

## 2020-07-03 LAB — PROTEIN, PLEURAL OR PERITONEAL FLUID: Total protein, fluid: <3 g/dL

## 2020-07-03 LAB — MAGNESIUM: Magnesium: 1.9 mg/dL (ref 1.7–2.4)

## 2020-07-03 LAB — PHOSPHORUS: Phosphorus: 3.2 mg/dL (ref 2.5–4.6)

## 2020-07-03 LAB — AMMONIA: Ammonia: 38 umol/L — ABNORMAL HIGH (ref 9–35)

## 2020-07-03 MED ORDER — SODIUM CHLORIDE 0.9 % IV SOLN: 250.0000 mL | INTRAVENOUS | Status: DC | PRN

## 2020-07-03 MED ORDER — ACETAMINOPHEN 325 MG PO TABS: 650.0000 mg | ORAL_TABLET | Freq: Four times a day (QID) | ORAL | Status: DC | PRN

## 2020-07-03 MED ORDER — LACTULOSE 10 GM/15ML PO SOLN
20.0000 g | Freq: Two times a day (BID) | ORAL | Status: DC
  Administered 2020-07-03: 20 g via ORAL
  Filled 2020-07-03: qty 30

## 2020-07-03 MED ORDER — SPIRONOLACTONE 25 MG PO TABS
50.0000 mg | ORAL_TABLET | Freq: Every day | ORAL | Status: DC
  Administered 2020-07-03 – 2020-07-04 (×2): 50 mg via ORAL
  Filled 2020-07-03 (×2): qty 2

## 2020-07-03 MED ORDER — SODIUM CHLORIDE 0.9% FLUSH: 3.0000 mL | INTRAVENOUS | Status: DC | PRN

## 2020-07-03 MED ORDER — SODIUM CHLORIDE 0.9% FLUSH
3.0000 mL | Freq: Two times a day (BID) | INTRAVENOUS | Status: DC
  Administered 2020-07-03: 3 mL via INTRAVENOUS

## 2020-07-03 MED ORDER — THIAMINE HCL 100 MG PO TABS
100.0000 mg | ORAL_TABLET | Freq: Every day | ORAL | Status: DC
  Administered 2020-07-03: 100 mg via ORAL

## 2020-07-03 MED ORDER — FOLIC ACID 1 MG PO TABS
1.0000 mg | ORAL_TABLET | Freq: Every day | ORAL | Status: DC
  Filled 2020-07-03: qty 1

## 2020-07-03 MED ORDER — LACTULOSE 10 GM/15ML PO SOLN
30.0000 g | Freq: Three times a day (TID) | ORAL | Status: DC
  Administered 2020-07-03 – 2020-07-05 (×5): 30 g via ORAL
  Filled 2020-07-03 (×6): qty 60

## 2020-07-03 MED ORDER — HYDROCODONE-ACETAMINOPHEN 5-325 MG PO TABS: 1.0000 | ORAL_TABLET | ORAL | Status: DC | PRN

## 2020-07-03 MED ORDER — LIDOCAINE HCL 1 % IJ SOLN
INTRAMUSCULAR | Status: AC
  Filled 2020-07-03: qty 20

## 2020-07-03 MED ORDER — SODIUM CHLORIDE 0.9% FLUSH
3.0000 mL | Freq: Two times a day (BID) | INTRAVENOUS | Status: DC
  Administered 2020-07-03 – 2020-07-04 (×3): 3 mL via INTRAVENOUS

## 2020-07-03 MED ORDER — THIAMINE HCL 100 MG/ML IJ SOLN: 100.0000 mg | Freq: Every day | INTRAMUSCULAR | Status: DC

## 2020-07-03 MED ORDER — ACETAMINOPHEN 650 MG RE SUPP: 650.0000 mg | Freq: Four times a day (QID) | RECTAL | Status: DC | PRN

## 2020-07-03 MED ORDER — NICOTINE 21 MG/24HR TD PT24
21.0000 mg | MEDICATED_PATCH | Freq: Every day | TRANSDERMAL | Status: DC
  Filled 2020-07-03 (×3): qty 1

## 2020-07-03 MED ORDER — DEXTROSE 50 % IV SOLN: 25.0000 mL | Freq: Once | INTRAVENOUS | Status: AC

## 2020-07-03 MED ORDER — LORAZEPAM 2 MG/ML IJ SOLN: 1.0000 mg | INTRAMUSCULAR | Status: DC | PRN

## 2020-07-03 MED ORDER — ALBUMIN HUMAN 25 % IV SOLN
50.0000 g | Freq: Once | INTRAVENOUS | Status: AC
  Administered 2020-07-03: 50 g via INTRAVENOUS
  Filled 2020-07-03: qty 200

## 2020-07-03 MED ORDER — ONDANSETRON HCL 4 MG/2ML IJ SOLN
4.0000 mg | Freq: Four times a day (QID) | INTRAMUSCULAR | Status: DC | PRN
  Administered 2020-07-03: 4 mg via INTRAVENOUS
  Filled 2020-07-03: qty 2

## 2020-07-03 MED ORDER — ADULT ONE DAILY GUMMIES PO CHEW: CHEWABLE_TABLET | Freq: Every evening | ORAL | Status: DC

## 2020-07-03 MED ORDER — LORAZEPAM 1 MG PO TABS: 1.0000 mg | ORAL_TABLET | ORAL | Status: DC | PRN

## 2020-07-03 MED ORDER — ADULT MULTIVITAMIN W/MINERALS CH
1.0000 | ORAL_TABLET | Freq: Every day | ORAL | Status: DC
  Administered 2020-07-03 – 2020-07-05 (×3): 1 via ORAL
  Filled 2020-07-03 (×3): qty 1

## 2020-07-03 MED ORDER — FOLIC ACID 1 MG PO TABS
1.0000 mg | ORAL_TABLET | Freq: Every day | ORAL | Status: DC
  Administered 2020-07-03 – 2020-07-05 (×3): 1 mg via ORAL
  Filled 2020-07-03 (×2): qty 1

## 2020-07-03 MED ORDER — FUROSEMIDE 40 MG PO TABS
40.0000 mg | ORAL_TABLET | Freq: Every day | ORAL | Status: DC
  Administered 2020-07-03 – 2020-07-05 (×3): 40 mg via ORAL
  Filled 2020-07-03 (×3): qty 1

## 2020-07-03 MED ORDER — ENOXAPARIN SODIUM 40 MG/0.4ML ~~LOC~~ SOLN: 40.0000 mg | SUBCUTANEOUS | Status: DC

## 2020-07-03 MED ORDER — ONDANSETRON HCL 4 MG/2ML IJ SOLN: 4.0000 mg | Freq: Four times a day (QID) | INTRAMUSCULAR | Status: DC | PRN

## 2020-07-03 MED ORDER — ONDANSETRON HCL 4 MG PO TABS: 4.0000 mg | ORAL_TABLET | Freq: Four times a day (QID) | ORAL | Status: DC | PRN

## 2020-07-03 MED ORDER — DEXTROSE 50 % IV SOLN
INTRAVENOUS | Status: AC
  Administered 2020-07-03: 25 mL via INTRAVENOUS
  Filled 2020-07-03: qty 50

## 2020-07-03 MED ORDER — SODIUM CHLORIDE 0.9 % IV BOLUS
250.0000 mL | Freq: Once | INTRAVENOUS | Status: AC
  Administered 2020-07-04: 250 mL via INTRAVENOUS

## 2020-07-03 MED ORDER — THIAMINE HCL 100 MG PO TABS
100.0000 mg | ORAL_TABLET | Freq: Every day | ORAL | Status: DC
  Administered 2020-07-04 – 2020-07-05 (×2): 100 mg via ORAL
  Filled 2020-07-03 (×3): qty 1

## 2020-07-03 NOTE — ED Notes (Signed)
Pt ambulated self from room to bathroom and back. Nurse aware.

## 2020-07-03 NOTE — Progress Notes (Signed)
PROGRESS NOTE    Matthew [name]  QQV:[id number] DOB: 1986/09/17 DOA: 07/02/2020 PCP: Patient, No Pcp Per  Brief Narrative: 34 year old male with history of heavy alcohol abuse for over 10 years, alcoholic cirrhosis with portal hypertension, ascites was hospitalized 2 weeks ago with decompensated cirrhosis, underwent paracentesis at the time which was negative for SBP, subsequently left AMA. -Subsequently developed worsening swelling, abdominal distention, reportedly his family was upset that he continued to drink, had been confused and unable to take care of himself and initiated IVC paperwork, and subsequently brought to the emergency room.  Family reported patient continuing to drink although he reports abstinence from EtOH since 5/21.  Work-up in the ED noted bilirubin of 8.6, white count of 21K, PT of 24.8  Assessment & Plan:   Decompensated alcoholic liver cirrhosis Hepatic encephalopathy Recurrent ascites -Recently hospitalized for same, left AMA on 7/7 -Plan for paracentesis again, albumin ordered -Appreciate gastroenterology input -Duplex ultrasound ordered to rule out Budd-Chiari syndrome -Continue lactulose twice daily today, ammonia level not considerably high this time  Leukocytosis -This appears to be somewhat chronic, he is afebrile, follow-up ascitic fluid studies -Improving, monitor  Macrocytic anemia -Check anemia panel, this is likely secondary to alcoholism  ETOH abuse -Counseled, reportedly quit on 5/21, which contradicts what family reported on admission  Tobacco abuse -Counseled  DVT prophylaxis: Lovenox Code Status: Full code Family Communication: Discussed patient in detail, no family at bedside Disposition Plan:  Status is: Observation  The patient will require care spanning > 2 midnights and should be moved to inpatient because: Inpatient level of care appropriate due to severity of illness  Dispo: The patient is from: Home               Anticipated d/c is to: Home              Anticipated d/c date is: 3 days              Patient currently is not medically stable to d/c.  Consultants:   [facility], IR   Procedures:   Antimicrobials:    Subjective: -Complains of abdominal distention and shortness of breath Objective: Vitals:   07/02/20 2300 07/03/20 0230 07/03/20 0436 07/03/20 0600  BP: 109/70 115/78 103/76 116/71  Pulse: (!) 107 102 102 103  Resp: 18 20 18 16   Temp:      TempSrc:      SpO2: 95% 100% 96% 97%   No intake or output data in the 24 hours ending 07/03/20 1108 There were no vitals filed for this visit.  Examination:  General exam: Chronically ill-appearing frail male sitting up in bed awake alert oriented to self, place and partly to time HEENT: Positive scleral icterus CVS: S1-S2, regular rate rhythm Lungs: Poor air movement bilaterally, otherwise clear Abdomen is firm, distended, nontender, positive fluid thrill, bowel sounds present Extremities: 2+ ankle edema Skin: Icterus noted Psychiatry: Poor insight and judgment    Data Reviewed:   CBC: Recent Labs  Lab 07/02/20 1708  WBC 16.2*  NEUTROABS 12.7*  HGB 10.9*  HCT 32.8*  MCV 103.1*  PLT 180   Basic Metabolic Panel: Recent Labs  Lab 07/02/20 1708 07/02/20 1739  NA 130*  --   K 3.7  --   CL 96*  --   CO2 20*  --   GLUCOSE 126*  --   BUN 6  --   CREATININE 0.58*  --   CALCIUM 8.5*  --   MG  --  1.9  PHOS  --  3.7   GFR: CrCl cannot be calculated (Unknown ideal weight.). Liver Function Tests: Recent Labs  Lab 07/02/20 1708  AST 66*  ALT 29  ALKPHOS 122  BILITOT 8.6*  PROT 7.0  ALBUMIN 2.2*   No results for input(s): LIPASE, AMYLASE in the last 168 hours. Recent Labs  Lab 07/02/20 1708 07/03/20 0500  AMMONIA 77* 38*   Coagulation Profile: Recent Labs  Lab 07/02/20 1708  INR 2.3*   Cardiac Enzymes: No results for input(s): CKTOTAL, CKMB, CKMBINDEX, TROPONINI in the last 168 hours. BNP (last 3  results) No results for input(s): PROBNP in the last 8760 hours. HbA1C: No results for input(s): HGBA1C in the last 72 hours. CBG: No results for input(s): GLUCAP in the last 168 hours. Lipid Profile: No results for input(s): CHOL, HDL, LDLCALC, TRIG, CHOLHDL, LDLDIRECT in the last 72 hours. Thyroid Function Tests: No results for input(s): TSH, T4TOTAL, FREET4, T3FREE, THYROIDAB in the last 72 hours. Anemia Panel: No results for input(s): VITAMINB12, FOLATE, FERRITIN, TIBC, IRON, RETICCTPCT in the last 72 hours. Urine analysis:    Component Value Date/Time   COLORURINE AMBER (A) 06/13/2020 0951   APPEARANCEUR HAZY (A) 06/13/2020 0951   LABSPEC 1.030 06/13/2020 0951   PHURINE 5.0 06/13/2020 0951   GLUCOSEU 50 (A) 06/13/2020 0951   HGBUR LARGE (A) 06/13/2020 0951   BILIRUBINUR MODERATE (A) 06/13/2020 0951   KETONESUR NEGATIVE 06/13/2020 0951   PROTEINUR 30 (A) 06/13/2020 0951   NITRITE NEGATIVE 06/13/2020 0951   LEUKOCYTESUR NEGATIVE 06/13/2020 0951   Sepsis Labs: @LABRCNTIP (procalcitonin:4,lacticidven:4)  ) Recent Results (from the past 240 hour(s))  SARS Coronavirus 2 by RT PCR (hospital order, performed in [facility] hospital lab) Nasopharyngeal Nasopharyngeal Swab     Status: None   Collection Time: 07/02/20  6:46 PM   Specimen: Nasopharyngeal Swab  Result Value Ref Range Status   SARS Coronavirus 2 NEGATIVE NEGATIVE Final    Comment: (NOTE) SARS-CoV-2 target nucleic acids are NOT DETECTED.  The SARS-CoV-2 RNA is generally detectable in upper and lower respiratory specimens during the acute phase of infection. The lowest concentration of SARS-CoV-2 viral copies this assay can detect is 250 copies / mL. A negative result does not preclude SARS-CoV-2 infection and should not be used as the sole basis for treatment or other patient management decisions.  A negative result may occur with improper specimen collection / handling, submission of specimen other than  nasopharyngeal swab, presence of viral mutation(s) within the areas targeted by this assay, and inadequate number of viral copies (<250 copies / mL). A negative result must be combined with clinical observations, patient history, and epidemiological information.  Fact Sheet for Patients:   07/04/20  Fact Sheet for Healthcare Providers: BoilerBrush.com.cy  This test is not yet approved or  cleared by the https://pope.com/ FDA and has been authorized for detection and/or diagnosis of SARS-CoV-2 by FDA under an Emergency Use Authorization (EUA).  This EUA will remain in effect (meaning this test can be used) for the duration of the COVID-19 declaration under Section 564(b)(1) of the Act, 21 U.S.C. section 360bbb-3(b)(1), unless the authorization is terminated or revoked sooner.  Performed at [facility], [address], [place], [place]          Radiology Studies: DG CHEST PORT 1 VIEW  Result Date: 07/02/2020 CLINICAL DATA:  Hepatic encephalopathy EXAM: PORTABLE CHEST 1 VIEW COMPARISON:  None FINDINGS: Low lung volumes and streaky basilar atelectasis. No  consolidation, features of edema, pneumothorax, or effusion. Pulmonary vascularity is normally distributed. The cardiomediastinal contours are unremarkable. No acute osseous or soft tissue abnormality. IMPRESSION: Low volumes and atelectasis without other acute cardiopulmonary abnormality. Electronically Signed   By: Kreg Shropshire M.D.   On: 07/02/2020 20:02        Scheduled Meds: . furosemide  40 mg Oral Daily  . spironolactone  50 mg Oral Daily   Continuous Infusions: . albumin human       LOS: 0 days    Time spent:  Zannie Cove, MD [facility]  07/03/2020, 11:08 AM

## 2020-07-03 NOTE — ED Notes (Addendum)
Pt. In burgundy scrubs and wanded by security. Pt. And belongings locked up in cabinet 13-15 and RES A And RES B. Pt. Has 1 belongings bag. Pt. Has cell phone, keys, 1pr. Flip flops, 1 cigarettes, 1 peace frame, 1 charger, 1 black pant and 1 white t-shirt.

## 2020-07-03 NOTE — ED Notes (Signed)
Per Shirlee Limerick, RN [facility], okay to transport to floor without sitter at this time, she will coordinate once on the floor

## 2020-07-03 NOTE — ED Notes (Signed)
Patient transported to [facility]

## 2020-07-03 NOTE — Procedures (Signed)
PROCEDURE SUMMARY:  Successful US guided paracentesis from right lateral abdomen.   Yielded 4 L of dark yellow fluid.  No immediate complications.  Pt tolerated well.   Specimen sent for labs.  EBL < 46mL  Mickie Kay, NP 07/03/2020 2:12 PM

## 2020-07-03 NOTE — ED Notes (Signed)
While starting an IV for the pt to go to IR, he complains of nausea and wanting to eat. Pt was advised that the admitting MD would be paged to see if he is able to eat pending his procedure. Pt CBG was checked and resulted as 40. MD notified and pt was given 23ml D50 per hypoglycemia protocol. MD responded with diet order for pt to eat. Pt was given juice and a tray was ordered. Pt is A&O x4 and in no distress

## 2020-07-03 NOTE — Consult Note (Signed)
Referring Provider:  [facility] Primary Care Physician:  Patient, No Pcp Per Primary Gastroenterologist: Unassigned  Reason for Consultation: Decompensated cirrhosis with ascites  HPI: Matthew [name] is a 34 y.o. male with past medical history of decompensated alcoholic cirrhosis, who was recently admitted to the hospital and left AMA presented to the hospital again with worsening abdominal distention.  During admission earlier this month, he underwent paracentesis with removal of 4 L of fluid.  Fluid analysis was negative for SBP.  Patient denies any other acute GI issues.  He is claiming that he is not drinking alcohol since May 2021 but based on chart review it looks like family member admits of ongoing alcohol use. Patient denies seeing any blood in the stool or black stool.  Denies any nausea or vomiting.  Denies any fever or chills.    No past medical history on file.  No past surgical history on file.  Prior to Admission medications   Medication Sig Start Date End Date Taking? Authorizing Provider  Multiple Vitamins-Minerals (ADULT ONE DAILY GUMMIES PO) Take 1 tablet by mouth every evening. CBD   Yes [provider]    Scheduled Meds: Continuous Infusions: . albumin human     PRN Meds:.  Allergies as of 07/02/2020  . (No Known Allergies)    Family History  Problem Relation Age of Onset  . Colon cancer Mother     Social History   Socioeconomic History  . Marital status: Single    Spouse name: Not on file  . Number of children: Not on file  . Years of education: Not on file  . Highest education level: Not on file  Occupational History  . Occupation: not employed  Tobacco Use  . Smoking status: Current Every Day Smoker    Packs/day: 0.25    Types: Cigarettes  . Smokeless tobacco: Never Used  Substance and Sexual Activity  . Alcohol use: Yes  . Drug use: Never  . Sexual activity: Not on file  Other Topics Concern  . Not on file  Social History  Narrative  . Not on file   Social Determinants of Health   Financial Resource Strain:   . Difficulty of Paying Living Expenses:   Food Insecurity:   . Worried About Programme researcher, broadcasting/film/video in the Last Year:   . Barista in the Last Year:   Transportation Needs:   . Freight forwarder (Medical):   Marland Kitchen Lack of Transportation (Non-Medical):   Physical Activity:   . Days of Exercise per Week:   . Minutes of Exercise per Session:   Stress:   . Feeling of Stress :   Social Connections:   . Frequency of Communication with Friends and Family:   . Frequency of Social Gatherings with Friends and Family:   . Attends Religious Services:   . Active Member of Clubs or Organizations:   . Attends Banker Meetings:   Marland Kitchen Marital Status:   Intimate Partner Violence:   . Fear of Current or Ex-Partner:   . Emotionally Abused:   Marland Kitchen Physically Abused:   . Sexually Abused:      Physical Exam: Vital signs: Vitals:   07/03/20 0436 07/03/20 0600  BP: 103/76 116/71  Pulse: 102 103  Resp: 18 16  Temp:    SpO2: 96% 97%   Physical Exam Constitutional:      General: He is not in acute distress. HENT:     Head: Normocephalic and atraumatic.  Nose: Nose normal.     Mouth/Throat:     Mouth: Mucous membranes are moist.     Pharynx: Oropharynx is clear.  Eyes:     General: Scleral icterus present.  Cardiovascular:     Rate and Rhythm: Normal rate and regular rhythm.     Heart sounds: No murmur heard.   Pulmonary:     Effort: Pulmonary effort is normal. No respiratory distress.  Abdominal:     General: Bowel sounds are normal. There is distension.     Tenderness: There is no abdominal tenderness. There is no guarding.  Musculoskeletal:     Cervical back: Normal range of motion.     Right lower leg: Edema present.     Left lower leg: Edema present.  Neurological:     Mental Status: He is alert and oriented to person, place, and time.  Psychiatric:        Mood and  Affect: Mood normal.        Judgment: Judgment normal.      Review of Systems  Constitutional: Negative for chills and fever.  HENT: Negative for hearing loss and tinnitus.   Eyes: Negative for blurred vision and double vision.  Respiratory: Negative for cough and hemoptysis.   Cardiovascular: Negative for chest pain and palpitations.  Gastrointestinal: Negative for abdominal pain, blood in stool, constipation, diarrhea, heartburn, nausea and vomiting.  Genitourinary: Negative for dysuria and urgency.  Musculoskeletal: Negative for myalgias and neck pain.  Skin: Positive for itching. Negative for rash.  Neurological: Positive for weakness. Negative for seizures.  Endo/Heme/Allergies: Does not bruise/bleed easily.  Psychiatric/Behavioral: Positive for substance abuse. Negative for hallucinations. The patient is not nervous/anxious.     GI:  Lab Results: Recent Labs    07/02/20 1708  WBC 16.2*  HGB 10.9*  HCT 32.8*  PLT 180   BMET Recent Labs    07/02/20 1708  NA 130*  K 3.7  CL 96*  CO2 20*  GLUCOSE 126*  BUN 6  CREATININE 0.58*  CALCIUM 8.5*   LFT Recent Labs    07/02/20 1708  PROT 7.0  ALBUMIN 2.2*  AST 66*  ALT 29  ALKPHOS 122  BILITOT 8.6*   PT/INR Recent Labs    07/02/20 1708  LABPROT 24.8*  INR 2.3*     Studies/Results: DG CHEST PORT 1 VIEW  Result Date: 07/02/2020 CLINICAL DATA:  Hepatic encephalopathy EXAM: PORTABLE CHEST 1 VIEW COMPARISON:  None FINDINGS: Low lung volumes and streaky basilar atelectasis. No consolidation, features of edema, pneumothorax, or effusion. Pulmonary vascularity is normally distributed. The cardiomediastinal contours are unremarkable. No acute osseous or soft tissue abnormality. IMPRESSION: Low volumes and atelectasis without other acute cardiopulmonary abnormality. Electronically Signed   By: Kreg Shropshire M.D.   On: 07/02/2020 20:02    Impression/Plan: Decompensated alcoholic cirrhosis with ascites.  Left AMA  earlier this month.  Probably not compliant with diuretics. MELD 28.  -Normal AFP, normal ferritin, mild elevated iron saturation at 64, normal ANA and antimitochondrial antibody and  negative hepatitis panel on June 12, 2020. -Hepatic encephalopathy.  Improving.  He is alert and oriented x3 today -Leukocytosis.  Chronic. -Abnormal CT abdomen June 13, 2020.  Middle and left hepatic veins were not able to seen.  Recommendations -------------------------- -Get ultrasound Doppler to rule out Budd-Chiari syndrome. -Agree with paracentesis. -Start Lasix 40 mg and spironolactone 50 mg. -Absolute alcohol abstinence discussed. -Recheck iron saturation. -Okay to have low sodium diet from GI standpoint -GI will follow  LOS: 0 days   Kathi Der  MD, FACP 07/03/2020, 9:51 AM  Contact #  [phone]

## 2020-07-03 NOTE — Progress Notes (Signed)
Received patient from ED, ex-wife at bedside.    07/03/20 1916  Vitals  Temp 99 F (37.2 C)  Temp Source Oral  BP 105/69  BP Location Right Arm  BP Method Automatic  Patient Position (if appropriate) Lying  Pulse Rate (!) 108  Pulse Rate Source Monitor  Resp 16  MEWS COLOR  MEWS Score Color Green  Oxygen Therapy  SpO2 97 %  O2 Device Room [facility]

## 2020-07-04 ENCOUNTER — Encounter (HOSPITAL_COMMUNITY): Payer: Self-pay | Admitting: Internal Medicine

## 2020-07-04 DIAGNOSIS — E43 Unspecified severe protein-calorie malnutrition: Secondary | ICD-10-CM | POA: Insufficient documentation

## 2020-07-04 LAB — COMPREHENSIVE METABOLIC PANEL
ALT: 19 U/L (ref 0–44)
AST: 46 U/L — ABNORMAL HIGH (ref 15–41)
Albumin: 2.5 g/dL — ABNORMAL LOW (ref 3.5–5.0)
Alkaline Phosphatase: 89 U/L (ref 38–126)
Anion gap: 9 (ref 5–15)
BUN: <5 mg/dL — ABNORMAL LOW (ref 6–20)
CO2: 24 mmol/L (ref 22–32)
Calcium: 7.9 mg/dL — ABNORMAL LOW (ref 8.9–10.3)
Chloride: 99 mmol/L (ref 98–111)
Creatinine, Ser: 0.54 mg/dL — ABNORMAL LOW (ref 0.61–1.24)
GFR calc Af Amer: >60 mL/min (ref >60)
GFR calc non Af Amer: >60 mL/min (ref >60)
Glucose, Bld: 99 mg/dL (ref 70–99)
Potassium: 3.2 mmol/L — ABNORMAL LOW (ref 3.5–5.1)
Sodium: 132 mmol/L — ABNORMAL LOW (ref 135–145)
Total Bilirubin: 6.7 mg/dL — ABNORMAL HIGH (ref 0.3–1.2)
Total Protein: 5.6 g/dL — ABNORMAL LOW (ref 6.5–8.1)

## 2020-07-04 LAB — CBC
HCT: 25.6 % — ABNORMAL LOW (ref 39.0–52.0)
Hemoglobin: 8.5 g/dL — ABNORMAL LOW (ref 13.0–17.0)
MCH: 34.7 pg — ABNORMAL HIGH (ref 26.0–34.0)
MCHC: 33.2 g/dL (ref 30.0–36.0)
MCV: 104.5 fL — ABNORMAL HIGH (ref 80.0–100.0)
Platelets: 127 10*3/uL — ABNORMAL LOW (ref 150–400)
RBC: 2.45 MIL/uL — ABNORMAL LOW (ref 4.22–5.81)
RDW: 12.5 % (ref 11.5–15.5)
WBC: 10.6 10*3/uL — ABNORMAL HIGH (ref 4.0–10.5)
nRBC: 0 % (ref 0.0–0.2)

## 2020-07-04 LAB — PROTIME-INR
INR: 2.9 — ABNORMAL HIGH (ref 0.8–1.2)
Prothrombin Time: 29.3 seconds — ABNORMAL HIGH (ref 11.4–15.2)

## 2020-07-04 LAB — IRON AND TIBC
Iron: 30 ug/dL — ABNORMAL LOW (ref 45–182)
Saturation Ratios: 35 % (ref 17.9–39.5)
TIBC: 85 ug/dL — ABNORMAL LOW (ref 250–450)
UIBC: 55 ug/dL

## 2020-07-04 LAB — FERRITIN: Ferritin: 271 ng/mL (ref 24–336)

## 2020-07-04 LAB — RETICULOCYTES
Immature Retic Fract: 6.3 % (ref 2.3–15.9)
RBC.: 2.45 MIL/uL — ABNORMAL LOW (ref 4.22–5.81)
Retic Count, Absolute: 57.1 10*3/uL (ref 19.0–186.0)
Retic Ct Pct: 2.3 % (ref 0.4–3.1)

## 2020-07-04 LAB — VITAMIN B12: Vitamin B-12: 1293 pg/mL — ABNORMAL HIGH (ref 180–914)

## 2020-07-04 LAB — FOLATE: Folate: 8.4 ng/mL (ref >5.9)

## 2020-07-04 MED ORDER — VITAMIN K1 10 MG/ML IJ SOLN
5.0000 mg | Freq: Once | INTRAVENOUS | Status: DC
  Filled 2020-07-04: qty 0.5

## 2020-07-04 MED ORDER — ALPRAZOLAM 0.25 MG PO TABS: 0.2500 mg | ORAL_TABLET | Freq: Every day | ORAL | Status: DC | PRN

## 2020-07-04 MED ORDER — VITAMIN K1 10 MG/ML IJ SOLN
10.0000 mg | Freq: Once | INTRAVENOUS | Status: AC
  Administered 2020-07-04: 10 mg via INTRAVENOUS
  Filled 2020-07-04: qty 1

## 2020-07-04 MED ORDER — POTASSIUM CHLORIDE CRYS ER 20 MEQ PO TBCR
40.0000 meq | EXTENDED_RELEASE_TABLET | Freq: Once | ORAL | Status: AC
  Administered 2020-07-04: 40 meq via ORAL
  Filled 2020-07-04: qty 2

## 2020-07-04 MED ORDER — ENSURE ENLIVE PO LIQD
237.0000 mL | ORAL | Status: DC
  Administered 2020-07-04: 237 mL via ORAL

## 2020-07-04 MED ORDER — PROSOURCE PLUS PO LIQD
30.0000 mL | Freq: Three times a day (TID) | ORAL | Status: DC
  Filled 2020-07-04 (×4): qty 30

## 2020-07-04 NOTE — Progress Notes (Signed)
Initial Nutrition Assessment  DOCUMENTATION CODES:   Severe malnutrition in context of chronic illness  INTERVENTION:  Ensure Enlive po daily, each supplement provides 350 kcal and 20 grams of protein  60ml Prosource Plus po TID, each supplement provides 100 kcal and 15 grams protein  Recommend liberalizing diet to encourage PO intake  NUTRITION DIAGNOSIS:   Severe Malnutrition related to chronic illness (decompensated alcoholic cirrhosis) as evidenced by energy intake < or equal to 75% for > or equal to 1 month, severe fat depletion, severe muscle depletion, percent weight loss.   GOAL:   Patient will meet greater than or equal to 90% of their needs    MONITOR:   PO intake, Supplement acceptance, Labs, Weight trends  REASON FOR ASSESSMENT:   Consult Malnutrition Eval  ASSESSMENT:   Pt with history of heavy EtOH abuse for over 10 years, alcoholic cirrhosis with portal hypertension, ascites, and recent hospitalization with decompensated cirrhosis requiring paracentesis at the time which was negative for SBP. Pt left AMA. Pt subsequently developed worsening swelling/abdominal distention. Pt's family was reportedly upset that he continued to drink, had been confused and unable to take care of himself and initiated IVC paperwork. Family states pt continuing to drink although he reports abstinence from EtOH since 5/21. Pt admitted with decompensated alcoholic cirrhosis, hepatic encephalopathy, recurrent ascites.  7/25 s/p paracentesis, yield 4L, negative for SBP  Per MD, plan for EGD for variceal screening tomorrow. Psych consult pending.   Pt states that his appetite at baseline is fair. He typically eats 3 meals per day, but states that generally only one of the meals has a protein source (typically something like lunch meat). Over the last 1-2 weeks, however, pt reports only being able to eat ~1/2 of his typical portion sizes. Pt states that he is willing to consume oral  nutrition supplements, but is requesting to receive mostly Prosource with only 1 Ensure daily due to volume.   Per wt readings, pt with an 11.2% wt loss x3 weeks. This is severe and significant for time frame.   PO Intake: 50-75% x 2 recorded meals  Labs: Na 132 (L), K+ 3.2 (L) Medications: Folvite, Lasix, Chronulac, MVI, Aldactone, Thiamine, IV Vitamin K  NUTRITION - FOCUSED PHYSICAL EXAM:    Most Recent Value  Orbital Region Mild depletion  Upper Arm Region Severe depletion  Thoracic and Lumbar Region Moderate depletion  Buccal Region Mild depletion  Temple Region Moderate depletion  Clavicle Bone Region Severe depletion  Clavicle and Acromion Bone Region Severe depletion  Scapular Bone Region Severe depletion  Dorsal Hand Mild depletion  Patellar Region Moderate depletion  Anterior Thigh Region Moderate depletion  Posterior Calf Region Moderate depletion  Edema (RD Assessment) None  Hair Reviewed  Eyes Reviewed  Mouth Reviewed  Skin Reviewed  Nails Reviewed       Diet Order:   Diet Order            Diet NPO time specified  Diet effective midnight           Diet 2 gram sodium Room service appropriate? Yes; Fluid consistency: Thin  Diet effective now                 EDUCATION NEEDS:   No education needs have been identified at this time  Skin:  Skin Assessment: Reviewed RN Assessment  Last BM:  7/26  Height:   Ht Readings from Last 1 Encounters:  07/03/20 5\' 7"  (1.702 m)    Weight:  Wt Readings from Last 2 Encounters:  07/03/20 55.3 kg  06/13/20 62.4 kg     BMI:  Body mass index is 19.09 kg/m.  Estimated Nutritional Needs:   Kcal:  1950-2150  Protein:  95-105 grams  Fluid:  >/=1.95L/d    Eugene Gavia, MS, RD, LDN RD pager number and weekend/on-call pager number located in Amion.

## 2020-07-04 NOTE — Consult Note (Addendum)
[facility] Face-to-Face Psychiatry Consult   Reason for Consult:  History of alcohol use disorder and rule out suicidal ideation Referring Physician:  Zannie Cove, MD Patient Identification: Matthew [name] MRN:  [id number] Principal Diagnosis: <principal problem not specified> Diagnosis:  Active Problems:   Ascites of liver   Hepatic cirrhosis (HCC)   Leukocytosis   Macrocytic anemia   Hepatic encephalopathy (HCC)   Alcohol use disorder, severe, in early remission (HCC)   Tobacco abuse   Total Time spent with patient: 30 minutes  Subjective:  Per Hospitalist Note Brief Narrative:  Matthew [name] is a 34 y.o. male with history of heavy alcohol abuse for over 10 years, alcoholic cirrhosis with portal hypertension, ascites was hospitalized 2 weeks ago with decompensated cirrhosis, underwent paracentesis at the time which was negative for SBP, subsequently left AMA. -Subsequently developed worsening swelling, abdominal distention, reportedly his family was upset that he continued to drink, had been confused and unable to take care of himself and initiated IVC paperwork, and subsequently brought to the emergency room.  Family reported patient continuing to drink although he reports abstinence from EtOH since 5/21.  Work-up in the ED noted bilirubin of 8.6, white count of 21K, PT of 24.8  HPI:  Matthew [name], 34 y.o., male patient seen face to face by this provider, consulted with Dr. Lucianne Muss; and chart reviewed on 07/04/20.  On evaluation [name] reports he was having abdominal pain and swelling.  "My ex-wife wanted me to go to the emergency room and I told her I was going to urgent care.  Why you going to got to emergency room and sit there to see someone when you can do it faster in a urgent care.  I don't have a primary doctor; that's why I was going to urgent care.  She didn't like that and that's how I ended up here."  Patient states that he and his ex-wife are still  pretty good friends and she is his primary supporter.  Patient reports a history of alcohol use disorder and has been sober since "May 25th" this year."  Reporting the first 5 days was hard but after that he has done good.  Patient states he is currently unemployed and lives alone.  Patient denies prior suicide attempt, self-harm, and violence.  Patient also denies prior inpatient/outpatient or rehab services and has never taken psychotropic medications.  Patient states that he is interested in taking medication at this time that will help with anxiety "since I've quit drinking; I've been having episodes of anxiety."  Patient also expresses some depression but feels he is stable.  During evaluation Elder Davidian is alert/oriented x 4; calm/cooperative; and mood is congruent with affect.  He does not appear to be responding to internal/external stimuli or delusional thoughts.  Patient denies suicidal/self-harm/homicidal ideation, psychosis, and paranoia.  Patient answered question appropriately.     Past Psychiatric History: Alcohol use disorder, sever  Risk to Self:  No Risk to Others:  No Prior Inpatient Therapy:  No Prior Outpatient Therapy:   No Past Medical History: History reviewed. No pertinent past medical history. History reviewed. No pertinent surgical history. Family History:  Family History  Problem Relation Age of Onset   Colon cancer Mother    Family Psychiatric  History: Denies Social History:  Social History   Substance and Sexual Activity  Alcohol Use Yes     Social History   Substance and Sexual Activity  Drug Use Never    Social History  Socioeconomic History   Marital status: Single    Spouse name: Not on file   Number of children: Not on file   Years of education: Not on file   Highest education level: Not on file  Occupational History   Occupation: not employed  Tobacco Use   Smoking status: Current Every Day Smoker    Packs/day: 0.25     Types: Cigarettes   Smokeless tobacco: Never Used  Substance and Sexual Activity   Alcohol use: Yes   Drug use: Never   Sexual activity: Not on file  Other Topics Concern   Not on file  Social History Narrative   Not on file   Social Determinants of Health   Financial Resource Strain:    Difficulty of Paying Living Expenses:   Food Insecurity:    Worried About Programme researcher, broadcasting/film/videounning Out of Food in the Last Year:    Baristaan Out of Food in the Last Year:   Transportation Needs:    Freight forwarderLack of Transportation (Medical):    Lack of Transportation (Non-Medical):   Physical Activity:    Days of Exercise per Week:    Minutes of Exercise per Session:   Stress:    Feeling of Stress :   Social Connections:    Frequency of Communication with Friends and Family:    Frequency of Social Gatherings with Friends and Family:    Attends Religious Services:    Active Member of Clubs or Organizations:    Attends BankerClub or Organization Meetings:    Marital Status:    Additional Social History:    Allergies:  No Known Allergies  Labs:  Results for orders placed or performed during the hospital encounter of 07/02/20 (from the past 48 hour(s))  CBC with Differential     Status: Abnormal   Collection Time: 07/02/20  5:08 PM  Result Value Ref Range   WBC 16.2 (H) 4.0 - 10.5 K/uL   RBC 3.18 (L) 4.22 - 5.81 MIL/uL   Hemoglobin 10.9 (L) 13.0 - 17.0 g/dL   HCT 16.132.8 (L) 39 - 52 %   MCV 103.1 (H) 80.0 - 100.0 fL   MCH 34.3 (H) 26.0 - 34.0 pg   MCHC 33.2 30.0 - 36.0 g/dL   RDW 09.612.6 04.511.5 - 40.915.5 %   Platelets 180 150 - 400 K/uL   nRBC 0.0 0.0 - 0.2 %   Neutrophils Relative % 77 %   Neutro Abs 12.7 (H) 1.7 - 7.7 K/uL   Lymphocytes Relative 10 %   Lymphs Abs 1.6 0.7 - 4.0 K/uL   Monocytes Relative 9 %   Monocytes Absolute 1.4 (H) 0 - 1 K/uL   Eosinophils Relative 2 %   Eosinophils Absolute 0.3 0 - 0 K/uL   Basophils Relative 1 %   Basophils Absolute 0.1 0 - 0 K/uL   Immature Granulocytes 1 %    Abs Immature Granulocytes 0.08 (H) 0.00 - 0.07 K/uL    Comment: Performed at [facility], [address] [place], [place] [id number]  Comprehensive metabolic panel     Status: Abnormal   Collection Time: 07/02/20  5:08 PM  Result Value Ref Range   Sodium 130 (L) 135 - 145 mmol/L   Potassium 3.7 3.5 - 5.1 mmol/L   Chloride 96 (L) 98 - 111 mmol/L   CO2 20 (L) 22 - 32 mmol/L   Glucose, Bld 126 (H) 70 - 99 mg/dL    Comment: Glucose reference range applies only to samples taken after  fasting for at least 8 hours.   BUN 6 6 - 20 mg/dL   Creatinine, Ser 8.88 (L) 0.61 - 1.24 mg/dL   Calcium 8.5 (L) 8.9 - 10.3 mg/dL   Total Protein 7.0 6.5 - 8.1 g/dL   Albumin 2.2 (L) 3.5 - 5.0 g/dL   AST 66 (H) 15 - 41 U/L   ALT 29 0 - 44 U/L   Alkaline Phosphatase 122 38 - 126 U/L   Total Bilirubin 8.6 (H) 0.3 - 1.2 mg/dL   GFR calc non Af Amer >60 >60 mL/min   GFR calc Af Amer >60 >60 mL/min   Anion gap 14 5 - 15    Comment: Performed at [facility], [address] [place], [place] [address]  Protime-INR     Status: Abnormal   Collection Time: 07/02/20  5:08 PM  Result Value Ref Range   Prothrombin Time 24.8 (H) 11.4 - 15.2 seconds   INR 2.3 (H) 0.8 - 1.2    Comment: (NOTE) INR goal varies based on device and disease states. Performed at [facility], [address], [place], [place] [address]   Ammonia     Status: Abnormal   Collection Time: 07/02/20  5:08 PM  Result Value Ref Range   Ammonia 77 (H) 9 - 35 umol/L    Comment: Performed at [facility], [address], [place], [place] [address]  Ethanol     Status: None   Collection Time: 07/02/20  5:08 PM  Result Value Ref Range   Alcohol, Ethyl (B) <10 <10 mg/dL    Comment: (NOTE) Lowest detectable limit for serum alcohol is 10 mg/dL.  For medical purposes only. Performed at [facility], [address] [place], [place] [address]    Salicylate level     Status: Abnormal   Collection Time: 07/02/20  5:08 PM  Result Value Ref Range   Salicylate Lvl <7.0 (L) 7.0 - 30.0 mg/dL    Comment: Performed at [facility], [address], [place], [place] [address]  Acetaminophen level     Status: Abnormal   Collection Time: 07/02/20  5:08 PM  Result Value Ref Range   Acetaminophen (Tylenol), Serum <10 (L) 10 - 30 ug/mL    Comment: (NOTE) Therapeutic concentrations vary significantly. A range of 10-30 ug/mL  may be an effective concentration for many patients. However, some  are best treated at concentrations outside of this range. Acetaminophen concentrations >150 ug/mL at 4 hours after ingestion  and >50 ug/mL at 12 hours after ingestion are often associated with  toxic reactions.  Performed at [facility], [address] [place], [place] [address]   Magnesium     Status: None   Collection Time: 07/02/20  5:39 PM  Result Value Ref Range   Magnesium 1.9 1.7 - 2.4 mg/dL    Comment: Performed at [facility], [address], [place], [place] [address]  Phosphorus     Status: None   Collection Time: 07/02/20  5:39 PM  Result Value Ref Range   Phosphorus 3.7 2.5 - 4.6 mg/dL    Comment: Performed at [facility], [address] [place], [place] [address]  SARS Coronavirus 2 by RT PCR (hospital order, performed in [facility] hospital lab) Nasopharyngeal Nasopharyngeal Swab     Status: None   Collection Time: 07/02/20  6:46 PM   Specimen: Nasopharyngeal Swab  Result Value Ref Range  SARS Coronavirus 2 NEGATIVE NEGATIVE    Comment: (NOTE) SARS-CoV-2 target nucleic acids are NOT DETECTED.  The SARS-CoV-2 RNA is generally detectable in upper and lower respiratory specimens during the acute phase of infection. The lowest concentration of SARS-CoV-2 viral copies this assay can detect is 250 copies / mL. A negative result does not preclude  SARS-CoV-2 infection and should not be used as the sole basis for treatment or other patient management decisions.  A negative result may occur with improper specimen collection / handling, submission of specimen other than nasopharyngeal swab, presence of viral mutation(s) within the areas targeted by this assay, and inadequate number of viral copies (<250 copies / mL). A negative result must be combined with clinical observations, patient history, and epidemiological information.  Fact Sheet for Patients:   BoilerBrush.com.cy  Fact Sheet for Healthcare Providers: https://pope.com/  This test is not yet approved or  cleared by the [place] FDA and has been authorized for detection and/or diagnosis of SARS-CoV-2 by FDA under an Emergency Use Authorization (EUA).  This EUA will remain in effect (meaning this test can be used) for the duration of the COVID-19 declaration under Section 564(b)(1) of the Act, 21 U.S.C. section 360bbb-3(b)(1), unless the authorization is terminated or revoked sooner.  Performed at [facility], [address] [place], [place] [address]   Ammonia     Status: Abnormal   Collection Time: 07/03/20  5:00 AM  Result Value Ref Range   Ammonia 38 (H) 9 - 35 umol/L    Comment: Performed at [facility], [address], [place], [place] [address]  Urinalysis, Routine w reflex microscopic     Status: Abnormal   Collection Time: 07/03/20 11:45 AM  Result Value Ref Range   Color, Urine AMBER (A) YELLOW    Comment: BIOCHEMICALS MAY BE AFFECTED BY COLOR   APPearance CLEAR CLEAR   Specific Gravity, Urine 1.029 1.005 - 1.030   pH 5.0 5.0 - 8.0   Glucose, UA 50 (A) NEGATIVE mg/dL   Hgb urine dipstick NEGATIVE NEGATIVE   Bilirubin Urine MODERATE (A) NEGATIVE   Ketones, ur 5 (A) NEGATIVE mg/dL   Protein, ur 30 (A) NEGATIVE mg/dL   Nitrite NEGATIVE NEGATIVE   Leukocytes,Ua  NEGATIVE NEGATIVE   RBC / HPF 0-5 0 - 5 RBC/hpf   Bacteria, UA NONE SEEN NONE SEEN   Squamous Epithelial / LPF 0-5 0 - 5   Mucus PRESENT    Ca Oxalate Crys, UA PRESENT     Comment: Performed at [facility], [address] [place], [place] [address]  CBG monitoring, ED     Status: Abnormal   Collection Time: 07/03/20 12:50 PM  Result Value Ref Range   Glucose-Capillary 40 (LL) 70 - 99 mg/dL    Comment: Glucose reference range applies only to samples taken after fasting for at least 8 hours.   Comment 1 Notify RN   CBG monitoring, ED     Status: Abnormal   Collection Time: 07/03/20  1:22 PM  Result Value Ref Range   Glucose-Capillary 150 (H) 70 - 99 mg/dL    Comment: Glucose reference range applies only to samples taken after fasting for at least 8 hours.  Comprehensive metabolic panel     Status: Abnormal   Collection Time: 07/03/20  1:46 PM  Result Value Ref Range   Sodium 131 (L) 135 - 145 mmol/L   Potassium 3.5 3.5 - 5.1 mmol/L   Chloride 97 (  L) 98 - 111 mmol/L   CO2 25 22 - 32 mmol/L   Glucose, Bld 124 (H) 70 - 99 mg/dL    Comment: Glucose reference range applies only to samples taken after fasting for at least 8 hours.   BUN 6 6 - 20 mg/dL   Creatinine, Ser 1.91 (L) 0.61 - 1.24 mg/dL   Calcium 7.8 (L) 8.9 - 10.3 mg/dL   Total Protein 6.0 (L) 6.5 - 8.1 g/dL   Albumin 2.0 (L) 3.5 - 5.0 g/dL   AST 56 (H) 15 - 41 U/L   ALT 24 0 - 44 U/L   Alkaline Phosphatase 103 38 - 126 U/L   Total Bilirubin 8.0 (H) 0.3 - 1.2 mg/dL   GFR calc non Af Amer >60 >60 mL/min   GFR calc Af Amer >60 >60 mL/min   Anion gap 9 5 - 15    Comment: Performed at [facility], [address], [place], [place] [address]  Magnesium     Status: None   Collection Time: 07/03/20  1:46 PM  Result Value Ref Range   Magnesium 1.9 1.7 - 2.4 mg/dL    Comment: Performed at [facility], [address], [place], [place] [address]  Phosphorus     Status:  None   Collection Time: 07/03/20  1:46 PM  Result Value Ref Range   Phosphorus 3.2 2.5 - 4.6 mg/dL    Comment: Performed at [facility], [address], [place], [place] [address]  CBC WITH DIFFERENTIAL     Status: Abnormal   Collection Time: 07/03/20  1:46 PM  Result Value Ref Range   WBC 12.8 (H) 4.0 - 10.5 K/uL   RBC 2.82 (L) 4.22 - 5.81 MIL/uL   Hemoglobin 9.9 (L) 13.0 - 17.0 g/dL   HCT 84.6 (L) 39 - 52 %   MCV 103.2 (H) 80.0 - 100.0 fL   MCH 35.1 (H) 26.0 - 34.0 pg   MCHC 34.0 30.0 - 36.0 g/dL   RDW 96.2 95.2 - 84.1 %   Platelets 147 (L) 150 - 400 K/uL   nRBC 0.0 0.0 - 0.2 %   Neutrophils Relative % 71 %   Neutro Abs 9.3 (H) 1.7 - 7.7 K/uL   Lymphocytes Relative 14 %   Lymphs Abs 1.7 0.7 - 4.0 K/uL   Monocytes Relative 11 %   Monocytes Absolute 1.4 (H) 0 - 1 K/uL   Eosinophils Relative 2 %   Eosinophils Absolute 0.2 0 - 0 K/uL   Basophils Relative 1 %   Basophils Absolute 0.1 0 - 0 K/uL   Immature Granulocytes 1 %   Abs Immature Granulocytes 0.07 0.00 - 0.07 K/uL    Comment: Performed at [facility], [address], [place], [place] [address]  TSH     Status: None   Collection Time: 07/03/20  1:46 PM  Result Value Ref Range   TSH 4.042 0.350 - 4.500 uIU/mL    Comment: Performed by a 3rd Generation assay with a functional sensitivity of <=0.01 uIU/mL. Performed at [facility], [address] [place], [place] [address]   Lactate dehydrogenase (pleural or peritoneal fluid)     Status: Abnormal   Collection Time: 07/03/20  2:28 PM  Result Value Ref Range   LD, Fluid 24 (H) 3 - 23 U/L    Comment: (NOTE) Results should be evaluated in conjunction with serum values    Fluid Type-FLDH CYTO PERI  Comment: Performed at [facility], [address], [place], [place] [address]  Body fluid cell count with differential     Status: Abnormal   Collection Time: 07/03/20  2:28 PM  Result Value Ref  Range   Fluid Type-FCT CYTO PERI    Color, Fluid STRAW (A) YELLOW   Appearance, Fluid CLEAR (A) CLEAR   Total Nucleated Cell Count, Fluid 64 0 - 1,000 cu mm   Neutrophil Count, Fluid 18 0 - 25 %   Lymphs, Fluid 24 %   Monocyte-Macrophage-Serous Fluid 58 50 - 90 %   Eos, Fluid 0 %   Other Cells, Fluid CORRELATE WITH CYTOLOGY. %    Comment: OTHER CELLS IDENTIFIED AS MESOTHELIAL CELLS Performed at [facility], [address], [place], [place] [address]   Body fluid culture     Status: None (Preliminary result)   Collection Time: 07/03/20  2:28 PM   Specimen: PATH Cytology Peritoneal fluid  Result Value Ref Range   Specimen Description      PERITONEAL Performed at [facility], [address], [place], [place] [address]    Special Requests      CYTO Performed at [facility], [address] [place], [place] [address]    Gram Stain      NO ORGANISMS SEEN WBC PRESENT, PREDOMINANTLY MONONUCLEAR CYTOSPIN SMEAR NOTIFIED ZULETTA,K RN @1553  ON 07/03/20 JACKSON,K Performed at [facility], [address] [place], [place]    Culture      NO GROWTH < 24 HOURS Performed at [facility], [address], [place], [place]    Report Status PENDING   Albumin, pleural or peritoneal fluid     Status: None   Collection Time: 07/03/20  2:28 PM  Result Value Ref Range   Albumin, Fluid <1.0 g/dL    Comment: (NOTE) No normal range established for this test Results should be evaluated in conjunction with serum values    Fluid Type-FALB CYTO PERI     Comment: Performed at [facility], [address] [place], [place]  Protein, pleural or peritoneal fluid     Status: None   Collection Time: 07/03/20  2:28 PM  Result Value Ref Range   Total protein, fluid <3.0 g/dL    Comment: (NOTE) No normal range established for this test Results should be evaluated in  conjunction with serum values    Fluid Type-FTP CYTO PERI     Comment: Performed at [facility], [address] [place], [place]  Protime-INR     Status: Abnormal   Collection Time: 07/03/20  3:40 PM  Result Value Ref Range   Prothrombin Time 29.4 (H) 11.4 - 15.2 seconds   INR 2.9 (H) 0.8 - 1.2    Comment: (NOTE) INR goal varies based on device and disease states. Performed at [facility], [address] [place], [place]   CBC     Status: Abnormal   Collection Time: 07/04/20  5:23 AM  Result Value Ref Range   WBC 10.6 (H) 4.0 - 10.5 K/uL   RBC 2.45 (L) 4.22 - 5.81 MIL/uL   Hemoglobin 8.5 (L) 13.0 - 17.0 g/dL   HCT 07/06/20 (L) 39 - 52 %   MCV 104.5 (H) 80.0 - 100.0 fL   MCH 34.7 (H) 26.0 - 34.0 pg   MCHC 33.2 30.0 - 36.0 g/dL   RDW  12.5 11.5 - 15.5 %   Platelets 127 (L) 150 - 400 K/uL   nRBC 0.0 0.0 - 0.2 %    Comment: Performed at [facility], [address], [place], [place] [address]  Comprehensive metabolic panel     Status: Abnormal   Collection Time: 07/04/20  5:23 AM  Result Value Ref Range   Sodium 132 (L) 135 - 145 mmol/L   Potassium 3.2 (L) 3.5 - 5.1 mmol/L   Chloride 99 98 - 111 mmol/L   CO2 24 22 - 32 mmol/L   Glucose, Bld 99 70 - 99 mg/dL    Comment: Glucose reference range applies only to samples taken after fasting for at least 8 hours.   BUN <5 (L) 6 - 20 mg/dL   Creatinine, Ser 9.14 (L) 0.61 - 1.24 mg/dL   Calcium 7.9 (L) 8.9 - 10.3 mg/dL   Total Protein 5.6 (L) 6.5 - 8.1 g/dL   Albumin 2.5 (L) 3.5 - 5.0 g/dL   AST 46 (H) 15 - 41 U/L   ALT 19 0 - 44 U/L   Alkaline Phosphatase 89 38 - 126 U/L   Total Bilirubin 6.7 (H) 0.3 - 1.2 mg/dL   GFR calc non Af Amer >60 >60 mL/min   GFR calc Af Amer >60 >60 mL/min   Anion gap 9 5 - 15    Comment: Performed at [facility], [address] [place], [place] [address]  Protime-INR     Status: Abnormal   Collection  Time: 07/04/20  5:23 AM  Result Value Ref Range   Prothrombin Time 29.3 (H) 11.4 - 15.2 seconds   INR 2.9 (H) 0.8 - 1.2    Comment: (NOTE) INR goal varies based on device and disease states. Performed at [facility], [address], [place], [place] [address]   Vitamin B12     Status: Abnormal   Collection Time: 07/04/20  5:23 AM  Result Value Ref Range   Vitamin B-12 1,293 (H) 180 - 914 pg/mL    Comment: (NOTE) This assay is not validated for testing neonatal or myeloproliferative syndrome specimens for Vitamin B12 levels. Performed at [facility], [address] [place], [place] [address]   Folate     Status: None   Collection Time: 07/04/20  5:23 AM  Result Value Ref Range   Folate 8.4 >5.9 ng/mL    Comment: Performed at [facility], [address], [place], [place] [address]  Iron and TIBC     Status: Abnormal   Collection Time: 07/04/20  5:23 AM  Result Value Ref Range   Iron 30 (L) 45 - 182 ug/dL   TIBC 85 (L) 952 - 841 ug/dL   Saturation Ratios 35 17.9 - 39.5 %   UIBC 55 ug/dL    Comment: Performed at  [facility], [address], [place], [place] [address]  Ferritin     Status: None   Collection Time: 07/04/20  5:23 AM  Result Value Ref Range   Ferritin 271 24 - 336 ng/mL    Comment: Performed at [facility], [address], [place], [place] [address]  Reticulocytes     Status: Abnormal   Collection Time: 07/04/20  5:23 AM  Result Value Ref Range   Retic Ct Pct 2.3 0.4 - 3.1 %   RBC. 2.45 (L) 4.22 - 5.81 MIL/uL   Retic Count, Absolute 57.1 19.0 - 186.0 K/uL  Immature Retic Fract 6.3 2.3 - 15.9 %    Comment: Performed at [facility], [address], [place], [place] [address]    Current Facility-Administered Medications  Medication Dose Route Frequency Provider Last Rate Last Admin   (feeding supplement) PROSource Plus liquid 30 mL  30 mL  Oral TID BM Doutova, Anastassia, MD       acetaminophen (TYLENOL) tablet 650 mg  650 mg Oral Q6H PRN Therisa Doyne, MD       Or   acetaminophen (TYLENOL) suppository 650 mg  650 mg Rectal Q6H PRN Therisa Doyne, MD       ALPRAZolam Prudy Feeler) tablet 0.25 mg  0.25 mg Oral Daily PRN Zannie Cove, MD       feeding supplement (ENSURE ENLIVE) (ENSURE ENLIVE) liquid 237 mL  237 mL Oral Q24H Doutova, Anastassia, MD       folic acid (FOLVITE) tablet 1 mg  1 mg Oral Daily Doutova, Anastassia, MD   1 mg at 07/04/20 1038   furosemide (LASIX) tablet 40 mg  40 mg Oral Daily Brahmbhatt, Parag, MD   40 mg at 07/04/20 1038   HYDROcodone-acetaminophen (NORCO/VICODIN) 5-325 MG per tablet 1-2 tablet  1-2 tablet Oral Q4H PRN Therisa Doyne, MD       lactulose (CHRONULAC) 10 GM/15ML solution 30 g  30 g Oral TID Therisa Doyne, MD   30 g at 07/04/20 1038   multivitamin with minerals tablet 1 tablet  1 tablet Oral Daily Therisa Doyne, MD   1 tablet at 07/04/20 1038   nicotine (NICODERM CQ - dosed in mg/24 hours) patch 21 mg  21 mg Transdermal Daily Doutova, Anastassia, MD       ondansetron (ZOFRAN) tablet 4 mg  4 mg Oral Q6H PRN Doutova, Anastassia, MD       Or   ondansetron (ZOFRAN) injection 4 mg  4 mg Intravenous Q6H PRN Doutova, Anastassia, MD       sodium chloride flush (NS) 0.9 % injection 3 mL  3 mL Intravenous Q12H Doutova, Anastassia, MD   3 mL at 07/04/20 1455   spironolactone (ALDACTONE) tablet 50 mg  50 mg Oral Daily Brahmbhatt, Parag, MD   50 mg at 07/04/20 1038   thiamine tablet 100 mg  100 mg Oral Daily Doutova, Anastassia, MD   100 mg at 07/04/20 1038   Or   thiamine (B-1) injection 100 mg  100 mg Intravenous Daily Therisa Doyne, MD        Musculoskeletal: Strength & Muscle Tone: within normal limits Gait & Station: normal Patient leans: N/A  Psychiatric Specialty Exam: Physical Exam Vitals and nursing note reviewed. Exam conducted with a  chaperone present.  Constitutional:      Comments: Patient appears below weight and jaundice   Pulmonary:     Effort: Pulmonary effort is normal.  Skin:    General: Skin is warm and dry.     Coloration: Skin is jaundiced.  Neurological:     Mental Status: He is alert.  Psychiatric:        Attention and Perception: Attention and perception normal.        Mood and Affect: Affect normal. Mood is anxious.        Speech: Speech normal.        Behavior: Behavior normal. Behavior is cooperative.        Thought Content: Thought content normal. Thought content is not paranoid. Thought content does not include homicidal or suicidal ideation.  Cognition and Memory: Cognition and memory normal.        Judgment: Judgment normal.     Review of Systems  Psychiatric/Behavioral: Negative for agitation, behavioral problems, decreased concentration, hallucinations, self-injury, sleep disturbance and suicidal ideas. The patient is nervous/anxious.     Blood pressure 107/65, pulse (!) 110, temperature 99 F (37.2 C), temperature source Oral, resp. rate 19, height 5\' 7"  (1.702 m), weight 55.3 kg, SpO2 98 %.Body mass index is 19.09 kg/m.  General Appearance: Casual  Eye Contact:  Good  Speech:  Clear and Coherent and Normal Rate  Volume:  Normal  Mood:  Anxious and Depressed  Affect:  Appropriate and Congruent  Thought Process:  Coherent, Goal Directed and Descriptions of Associations: Intact  Orientation:  Full (Time, Place, and Person)  Thought Content:  WDL  Suicidal Thoughts:  No  Homicidal Thoughts:  No  Memory:  Immediate;   Good Recent;   Good Remote;   Good  Judgement:  Intact  Insight:  Present  Psychomotor Activity:  Normal  Concentration:  Concentration: Good and Attention Span: Good  Recall:  Good  Fund of Knowledge:  Good  Language:  Good  Akathisia:  No  Handed:  Right  AIMS (if indicated):     Assets:  Communication Skills Desire for Improvement Housing Social  Support  ADL's:  Intact  Cognition:  WNL  Sleep:      Assessment:  Patient denies prior psychiatric history other than alcohol use disorder in which he states he has not drank anything since May 03, 2020.  Patient expressing some depression and anxiety related to current state but feels he is headed in the right direction.  Patient doesn't feel that he needs inpatient psychiatric treatment and denies suicidal/self-harm/homicidal ideation, psychosis, and paranoia.  Patient interested in starting a psychotropic that will help with depression, mainly anxiety.  Discussed Prozac but explained to patient that it is mainly metabolized in liver and with his current liver function (jaundice) would not be a good medication. No medications started at this time.  Recommend Seroquel 50 Q hs if feel that a medication has to be started at this time.  Will order a peer support and social work consult to assist patient with outpatient psychiatric and substance use services.    Treatment Plan Summary: Plan Psychiatrically clear; Order peer support and social work to set up with outpatient psychiatric services.  Disposition:  Psychiatrically cleared No evidence of imminent risk to self or others at present.   Patient does not meet criteria for psychiatric inpatient admission. Supportive therapy provided about ongoing stressors. Refer to IOP. Discussed crisis plan, support from social network, calling 911, coming to the Emergency Department, and calling Suicide Hotline.  Message sent to May 05, 2020, MD informing that patient has been psychiatrically cleared.  No medications started at this time.  Recommend Seroquel 50 Q hs if feel that a medication has to be started at this time.  Will order a peer support and social work consult to assist patient with outpatient psychiatric and substance use services.   Sundeep Destin, NP 07/04/2020 4:09 PM

## 2020-07-04 NOTE — Progress Notes (Addendum)
[facility] Gastroenterology Progress Note  Matthew [name] 34 y.o. 03-26-1986  CC:  Decompensated cirrhosis  Subjective: Patient feeling much better after paracentesis and states lactulose is making him feel better as well.  He is having loose brown stools.  Denies abdominal pain, nausea, vomiting.  Endorses some anxiety.    ROS : Review of Systems  Respiratory: Negative for cough and shortness of breath.   Gastrointestinal: Positive for diarrhea. Negative for abdominal pain, blood in stool, constipation, heartburn, melena, nausea and vomiting.   Objective: Vital signs in last 24 hours: Vitals:   07/04/20 0131 07/04/20 0625  BP: (!) 106/58 105/73  Pulse: 102 103  Resp: 17 18  Temp: 99.5 F (37.5 C) 99.1 F (37.3 C)  SpO2: 97% 97%    Physical Exam:  General:  Lethargic, ill-appearing, thin, oriented, cooperative, no acute distress  Head:  Normocephalic, without obvious abnormality, atraumatic  Eyes:  Icteric sclera, EOMs intact  Lungs:   Clear to auscultation bilaterally, respirations unlabored  Heart:  Regular rate and rhythm, S1, S2 normal  Abdomen:   Soft, non-tender, non-distended, bowel sounds active all four quadrants,  no guarding or peritoneal signs   Extremities: Extremities normal, atraumatic, no  edema  Pulses: 2+ and symmetric    Lab Results: Recent Labs    07/02/20 1708 07/02/20 1739 07/03/20 1346 07/04/20 0523  NA   < >  --  131* 132*  K   < >  --  3.5 3.2*  CL   < >  --  97* 99  CO2   < >  --  25 24  GLUCOSE   < >  --  124* 99  BUN   < >  --  6 <5*  CREATININE   < >  --  0.58* 0.54*  CALCIUM   < >  --  7.8* 7.9*  MG  --  1.9 1.9  --   PHOS  --  3.7 3.2  --    < > = values in this interval not displayed.   Recent Labs    07/03/20 1346 07/04/20 0523  AST 56* 46*  ALT 24 19  ALKPHOS 103 89  BILITOT 8.0* 6.7*  PROT 6.0* 5.6*  ALBUMIN 2.0* 2.5*   Recent Labs    07/02/20 1708 07/02/20 1708 07/03/20 1346 07/04/20 0523  WBC 16.2*   < >  12.8* 10.6*  NEUTROABS 12.7*  --  9.3*  --   HGB 10.9*   < > 9.9* 8.5*  HCT 32.8*   < > 29.1* 25.6*  MCV 103.1*   < > 103.2* 104.5*  PLT 180   < > 147* 127*   < > = values in this interval not displayed.   Recent Labs    07/03/20 1540 07/04/20 0523  LABPROT 29.4* 29.3*  INR 2.9* 2.9*    Assessment: Decompensated alcoholic cirrhosis with ascites. MELD score of 28 as of 07/04/20.  -T bili 6.7/AST 46/ALT 19/ALP 89 today -INR 2.9 -Paracentesis yesterday with 4L removed, cytology pending -Normal AFP, normal ferritin, mild elevated iron saturation at 64, normal ANA and antimitochondrial antibody and  negative hepatitis panel on June 12, 2020.  Hepatic encephalopathy, improved on lactulose.  He is alert and oriented x3 today  Leukocytosis, chronic, 10.6 today  Plan: Continue Lasix 40 mg and spironolactone 50 mg.  Continue lactulose titrated for 2-3 soft BMs per day.  Absolute alcohol abstinence discussed.  EGD tomorrow for variceal screening.  Vitamin K 10 mg today due  to INR of 2.9.  NPO after midnight.  [facility] will follow.  Edrick Kins PA-C 07/04/2020, 10:02 AM  Contact #  [phone]

## 2020-07-04 NOTE — Progress Notes (Signed)
PROGRESS NOTE    Matthew [name]  NLZ:[id number] DOB: February 05, 1986 DOA: 07/02/2020 PCP: Patient, No Pcp Per  Brief Narrative: 34 year old male with history of heavy alcohol abuse for over 10 years, alcoholic cirrhosis with portal hypertension, ascites was hospitalized 2 weeks ago with decompensated cirrhosis, underwent paracentesis at the time which was negative for SBP, subsequently left AMA. -Subsequently developed worsening swelling, abdominal distention, reportedly his family was upset that he continued to drink, had been confused and unable to take care of himself and initiated IVC paperwork, and subsequently brought to the emergency room.  Family reported patient continuing to drink although he reports abstinence from EtOH since 5/21.  Work-up in the ED noted bilirubin of 8.6, white count of 21K, PT of 24.8  Assessment & Plan:   Decompensated alcoholic liver cirrhosis Hepatic encephalopathy Recurrent ascites -Recently hospitalized for same, left AMA on 7/7 -Underwent large-volume paracentesis yesterday 7/25, 4 L of amber-colored fluid drained, negative for SBP -Duplex ultrasound negative for Budd-Chiari syndrome -Appreciate gastroenterology input -Plan for EGD for variceal screening tomorrow -Also continue lactulose  IVC -Patient was involuntarily committed by his ex-wife, reportedly felt to be a danger to self/others then -Patient denies any suicidal or homicidal ideations -Discussed with social work, psych consulted  Leukocytosis -This appears to be somewhat chronic, he is afebrile, ascitic fluid negative for SBP -Improving, monitor  Hypokalemia -replace  Macrocytic anemia -likely secondary to alcoholism, anemia panel unremarkable  ETOH abuse -Counseled, reportedly quit on 5/21, which contradicts what family reported on admission  Tobacco abuse -Counseled  DVT prophylaxis: Lovenox Code Status: Full code Family Communication: Discussed patient in detail, no family  at bedside Disposition Plan:  Status is: Inpatient  Inpatient due to severity of illness  Dispo: The patient is from: Home              Anticipated d/c is to: Home              Anticipated d/c date is: 1-2days              Patient currently is not medically stable to d/c.  Consultants:   [facility], IR   Procedures:   Antimicrobials:    Subjective: -Complains of abdominal distention and shortness of breath Objective: Vitals:   07/03/20 2316 07/04/20 0131 07/04/20 0625 07/04/20 1022  BP:  (!) 106/58 105/73 (!) 106/58  Pulse:  102 103 (!) 109  Resp: 20 17 18 18   Temp:  99.5 F (37.5 C) 99.1 F (37.3 C) 99.7 F (37.6 C)  TempSrc:  Oral Oral Oral  SpO2:  97% 97% 96%  Weight:      Height:        Intake/Output Summary (Last 24 hours) at 07/04/2020 1313 Last data filed at 07/04/2020 0856 Gross per 24 hour  Intake 645.33 ml  Output --  Net 645.33 ml   Filed Weights   07/03/20 2240  Weight: 55.3 kg    Examination:  General exam: Chronically ill frail male, sitting up in bed, awake alert oriented x3, no distress HEENT: Positive scleral icterus CVS: S1-S2, regular rate rhythm Lungs: Improved air movement, diminished at the bases Abdomen: Soft, less distended, nontender, positive fluid thrill, bowel sounds present extremities: Trace ankle edema Skin: Positive icterus Psychiatry: Appropriate mood and affect    Data Reviewed:   CBC: Recent Labs  Lab 07/02/20 1708 07/03/20 1346 07/04/20 0523  WBC 16.2* 12.8* 10.6*  NEUTROABS 12.7* 9.3*  --   HGB 10.9* 9.9* 8.5*  HCT 32.8*  29.1* 25.6*  MCV 103.1* 103.2* 104.5*  PLT 180 147* 127*   Basic Metabolic Panel: Recent Labs  Lab 07/02/20 1708 07/02/20 1739 07/03/20 1346 07/04/20 0523  NA 130*  --  131* 132*  K 3.7  --  3.5 3.2*  CL 96*  --  97* 99  CO2 20*  --  25 24  GLUCOSE 126*  --  124* 99  BUN 6  --  6 <5*  CREATININE 0.58*  --  0.58* 0.54*  CALCIUM 8.5*  --  7.8* 7.9*  MG  --  1.9 1.9  --   PHOS   --  3.7 3.2  --    GFR: Estimated Creatinine Clearance: 99.8 mL/min (A) (by C-G formula based on SCr of 0.54 mg/dL (L)). Liver Function Tests: Recent Labs  Lab 07/02/20 1708 07/03/20 1346 07/04/20 0523  AST 66* 56* 46*  ALT 29 24 19   ALKPHOS 122 103 89  BILITOT 8.6* 8.0* 6.7*  PROT 7.0 6.0* 5.6*  ALBUMIN 2.2* 2.0* 2.5*   No results for input(s): LIPASE, AMYLASE in the last 168 hours. Recent Labs  Lab 07/02/20 1708 07/03/20 0500  AMMONIA 77* 38*   Coagulation Profile: Recent Labs  Lab 07/02/20 1708 07/03/20 1540 07/04/20 0523  INR 2.3* 2.9* 2.9*   Cardiac Enzymes: No results for input(s): CKTOTAL, CKMB, CKMBINDEX, TROPONINI in the last 168 hours. BNP (last 3 results) No results for input(s): PROBNP in the last 8760 hours. HbA1C: No results for input(s): HGBA1C in the last 72 hours. CBG: Recent Labs  Lab 07/03/20 1250 07/03/20 1322  GLUCAP 40* 150*   Lipid Profile: No results for input(s): CHOL, HDL, LDLCALC, TRIG, CHOLHDL, LDLDIRECT in the last 72 hours. Thyroid Function Tests: Recent Labs    07/03/20 1346  TSH 4.042   Anemia Panel: Recent Labs    07/04/20 0523  VITAMINB12 1,293*  FOLATE 8.4  FERRITIN 271  TIBC 85*  IRON 30*  RETICCTPCT 2.3   Urine analysis:    Component Value Date/Time   COLORURINE AMBER (A) 07/03/2020 1145   APPEARANCEUR CLEAR 07/03/2020 1145   LABSPEC 1.029 07/03/2020 1145   PHURINE 5.0 07/03/2020 1145   GLUCOSEU 50 (A) 07/03/2020 1145   HGBUR NEGATIVE 07/03/2020 1145   BILIRUBINUR MODERATE (A) 07/03/2020 1145   KETONESUR 5 (A) 07/03/2020 1145   PROTEINUR 30 (A) 07/03/2020 1145   NITRITE NEGATIVE 07/03/2020 1145   LEUKOCYTESUR NEGATIVE 07/03/2020 1145   Sepsis Labs: @LABRCNTIP (procalcitonin:4,lacticidven:4)  ) Recent Results (from the past 240 hour(s))  SARS Coronavirus 2 by RT PCR (hospital order, [facility]) Nasopharyngeal Nasopharyngeal Swab     Status: None   Collection Time:  07/02/20  6:46 PM   Specimen: Nasopharyngeal Swab  Result Value Ref Range Status   SARS Coronavirus 2 NEGATIVE NEGATIVE Final    Comment: (NOTE) SARS-CoV-2 target nucleic acids are NOT DETECTED.  The SARS-CoV-2 RNA is generally detectable in upper and lower respiratory specimens during the acute phase of infection. The lowest concentration of SARS-CoV-2 viral copies this assay can detect is 250 copies / mL. A negative result does not preclude SARS-CoV-2 infection and should not be used as the sole basis for treatment or other patient management decisions.  A negative result may occur with improper specimen collection / handling, submission of specimen other than nasopharyngeal swab, presence of viral mutation(s) within the areas targeted by this assay, and inadequate number of viral copies (<250 copies / mL). A negative result must be combined  with clinical observations, patient history, and epidemiological information.  Fact Sheet for Patients:   BoilerBrush.com.cy  Fact Sheet for Healthcare Providers: https://pope.com/  This test is not yet approved or  cleared by the [place] FDA and has been authorized for detection and/or diagnosis of SARS-CoV-2 by FDA under an Emergency Use Authorization (EUA).  This EUA will remain in effect (meaning this test can be used) for the duration of the COVID-19 declaration under Section 564(b)(1) of the Act, 21 U.S.C. section 360bbb-3(b)(1), unless the authorization is terminated or revoked sooner.  Performed at [facility], [address] [place], [place] [address]   Body fluid culture     Status: None (Preliminary result)   Collection Time: 07/03/20  2:28 PM   Specimen: PATH Cytology Peritoneal fluid  Result Value Ref Range Status   Specimen Description   Final    PERITONEAL Performed at [facility], [address] [place], [place] [address]     Special Requests   Final    CYTO Performed at [facility], [address] [place], [place] [address]    Gram Stain   Final    NO ORGANISMS SEEN WBC PRESENT, PREDOMINANTLY MONONUCLEAR CYTOSPIN SMEAR NOTIFIED ZULETTA,K RN @1553  ON 07/03/20 JACKSON,K Performed at [facility], [address] [place], [place]    Culture   Final    NO GROWTH < 24 HOURS Performed at [facility], [address], [place], [place]    Report Status PENDING  Incomplete         Radiology Studies: 20947 Paracentesis  Result Date: 07/03/2020 INDICATION: Patient with a history of alcoholic cirrhosis and recurrent ascites. Interventional radiology asked to perform a therapeutic and diagnostic paracentesis. EXAM: ULTRASOUND GUIDED PARACENTESIS MEDICATIONS: 1% lidocaine 10 mL COMPLICATIONS: None immediate. PROCEDURE: Informed written consent was obtained from the patient after a discussion of the risks, benefits and alternatives to treatment. A timeout was performed prior to the initiation of the procedure. Initial ultrasound scanning demonstrates a large amount of ascites within the right lower abdominal quadrant. The right lower abdomen was prepped and draped in the usual sterile fashion. 1% lidocaine was used for local anesthesia. Following this, a 19 gauge, 7-cm, Yueh catheter was introduced. An ultrasound image was saved for documentation purposes. The paracentesis was performed. The catheter was removed and a dressing was applied. The patient tolerated the procedure well without immediate post procedural complication. FINDINGS: A total of approximately 4 L of dark yellow fluid was removed. Samples were sent to the laboratory as requested by the clinical team. IMPRESSION: Successful ultrasound-guided paracentesis yielding 4 liters of peritoneal fluid. Read by: 07/05/2020, NP Electronically Signed   By: Alwyn Ren M.D.   On: 07/03/2020 14:11   DG CHEST  PORT 1 VIEW  Result Date: 07/02/2020 CLINICAL DATA:  Hepatic encephalopathy EXAM: PORTABLE CHEST 1 VIEW COMPARISON:  None FINDINGS: Low lung volumes and streaky basilar atelectasis. No consolidation, features of edema, pneumothorax, or effusion. Pulmonary vascularity is normally distributed. The cardiomediastinal contours are unremarkable. No acute osseous or soft tissue abnormality. IMPRESSION: Low volumes and atelectasis without other acute cardiopulmonary abnormality. Electronically Signed   By: 07/04/2020 M.D.   On: 07/02/2020 20:02   07/04/2020 LIVER DOPPLER  Result Date: 07/03/2020 CLINICAL DATA:  Ascites, rule out Budd-Chiari syndrome EXAM: DUPLEX ULTRASOUND OF LIVER TECHNIQUE: Color and duplex Doppler ultrasound was performed to evaluate the hepatic in-flow and out-flow vessels. COMPARISON:  CT 06/13/2020 FINDINGS: Liver: Normal parenchymal echogenicity. Minimally nodular hepatic contour. No biliary ductal dilatation.  0.8 cm probable cyst, left lobe. Main Portal Vein size: 0.9 cm Portal Vein Velocities (all hepatopetal): Main Prox:  22 cm/sec Main Mid: 28 cm/sec Main Dist:  21 cm/sec Right: 24 cm/sec Left: 10 cm/sec Hepatic Vein Velocities (all hepatofugal): Right:  72 cm/sec Middle:  88 cm/sec Left:  39 cm/sec IVC: Patent with normal color Doppler flow signal Hepatic Artery Velocity:  233 cm/sec Splenic Vein Velocity:  46 cm/sec Spleen: 9.4 cm x 4.5 cm x 13 cm with a total volume of 287 cm^3 (411 cm^3 is upper limit normal) Portal Vein Occlusion/Thrombus: No Splenic Vein Occlusion/Thrombus: No Ascites: Present, moderate to large volume Varices: None identified IMPRESSION: 1. Unremarkable hepatic vascular Doppler evaluation. No evidence of Budd-Chiari syndrome. 2. Abdominal ascites Electronically Signed   By: Corlis Leak M.D.   On: 07/03/2020 11:43        Scheduled Meds: . folic acid  1 mg Oral Daily  . furosemide  40 mg Oral Daily  . lactulose  30 g Oral TID  . multivitamin with minerals  1  tablet Oral Daily  . nicotine  21 mg Transdermal Daily  . sodium chloride flush  3 mL Intravenous Q12H  . spironolactone  50 mg Oral Daily  . thiamine  100 mg Oral Daily   Or  . thiamine  100 mg Intravenous Daily   Continuous Infusions: . phytonadione (VITAMIN K) IV       LOS: 1 day    Time spent:  Zannie Cove, MD [facility]  07/04/2020, 1:13 PM

## 2020-07-05 DIAGNOSIS — F1021 Alcohol dependence, in remission: Secondary | ICD-10-CM

## 2020-07-05 DIAGNOSIS — R188 Other ascites: Secondary | ICD-10-CM

## 2020-07-05 LAB — COMPREHENSIVE METABOLIC PANEL
ALT: 21 U/L (ref 0–44)
AST: 53 U/L — ABNORMAL HIGH (ref 15–41)
Albumin: 2.5 g/dL — ABNORMAL LOW (ref 3.5–5.0)
Alkaline Phosphatase: 117 U/L (ref 38–126)
Anion gap: 10 (ref 5–15)
BUN: <5 mg/dL — ABNORMAL LOW (ref 6–20)
CO2: 25 mmol/L (ref 22–32)
Calcium: 8 mg/dL — ABNORMAL LOW (ref 8.9–10.3)
Chloride: 97 mmol/L — ABNORMAL LOW (ref 98–111)
Creatinine, Ser: 0.63 mg/dL (ref 0.61–1.24)
GFR calc Af Amer: >60 mL/min (ref >60)
GFR calc non Af Amer: >60 mL/min (ref >60)
Glucose, Bld: 106 mg/dL — ABNORMAL HIGH (ref 70–99)
Potassium: 3.9 mmol/L (ref 3.5–5.1)
Sodium: 132 mmol/L — ABNORMAL LOW (ref 135–145)
Total Bilirubin: 5.3 mg/dL — ABNORMAL HIGH (ref 0.3–1.2)
Total Protein: 6.1 g/dL — ABNORMAL LOW (ref 6.5–8.1)

## 2020-07-05 LAB — CBC
HCT: 26.8 % — ABNORMAL LOW (ref 39.0–52.0)
Hemoglobin: 9 g/dL — ABNORMAL LOW (ref 13.0–17.0)
MCH: 34.9 pg — ABNORMAL HIGH (ref 26.0–34.0)
MCHC: 33.6 g/dL (ref 30.0–36.0)
MCV: 103.9 fL — ABNORMAL HIGH (ref 80.0–100.0)
Platelets: 125 10*3/uL — ABNORMAL LOW (ref 150–400)
RBC: 2.58 MIL/uL — ABNORMAL LOW (ref 4.22–5.81)
RDW: 12.6 % (ref 11.5–15.5)
WBC: 13.6 10*3/uL — ABNORMAL HIGH (ref 4.0–10.5)
nRBC: 0 % (ref 0.0–0.2)

## 2020-07-05 LAB — CYTOLOGY - NON PAP

## 2020-07-05 SURGERY — EGD (ESOPHAGOGASTRODUODENOSCOPY)
Anesthesia: Monitor Anesthesia Care

## 2020-07-05 MED ORDER — SPIRONOLACTONE 25 MG PO TABS
100.0000 mg | ORAL_TABLET | Freq: Every day | ORAL | Status: DC
  Administered 2020-07-05: 100 mg via ORAL
  Filled 2020-07-05: qty 4

## 2020-07-05 MED ORDER — FOLIC ACID 1 MG PO TABS: 1.0000 mg | ORAL_TABLET | Freq: Every day | ORAL | 0 refills | Status: DC

## 2020-07-05 MED ORDER — SPIRONOLACTONE 100 MG PO TABS: 100.0000 mg | ORAL_TABLET | Freq: Every day | ORAL | 0 refills | Status: DC

## 2020-07-05 MED ORDER — FUROSEMIDE 40 MG PO TABS: 40.0000 mg | ORAL_TABLET | Freq: Every day | ORAL | 0 refills | Status: DC

## 2020-07-05 MED ORDER — THIAMINE HCL 100 MG PO TABS: 100.0000 mg | ORAL_TABLET | Freq: Every day | ORAL | 0 refills | Status: DC

## 2020-07-05 MED ORDER — LACTULOSE 10 GM/15ML PO SOLN: 20.0000 g | Freq: Three times a day (TID) | ORAL | 0 refills | Status: DC

## 2020-07-05 NOTE — TOC Transition Note (Signed)
Transition of Care [facility]) - CM/SW Discharge Note   Patient Details  Name: Matthew [name] MRN: [id number] Date of Birth: 1986-08-15  Transition of Care [facility]) CM/SW Contact:  Darleene Cleaver, LCSW Phone Number: 07/05/2020, 12:20 PM   Clinical Narrative:     Patient was under IVC, Psych cleared patient and IVC was rescinded.  CSW faxed commitment change form to the [place] office.  Patient requested information about substance abuse, CSW provided patient with list of outpatient substance abuse treatment centers.  CSW signing off, patient does not have any other needs.   Final next level of care: Home/Self Care Barriers to Discharge: Barriers Resolved   Patient Goals and CMS Choice Patient states their goals for this hospitalization and ongoing recovery are:: To return back home. CMS Medicare.gov Compare Post Acute Care list provided to:: Patient Choice offered to / list presented to : Patient  Discharge Placement                       Discharge Plan and Services                DME Arranged: N/A       Representative spoke with at DME Agency: na            Social Determinants of Health (SDOH) Interventions     Readmission Risk Interventions No flowsheet data found.

## 2020-07-05 NOTE — Progress Notes (Signed)
[facility] Gastroenterology Progress Note  Matthew [name] 34 y.o. [birth year]  CC:  Decompensated cirrhosis  Subjective: Patient feels "fine" this morning, states he is hungry. He is having loose brown stools on lactulose.  Denies abdominal pain, nausea, vomiting.    He refuses EGD for variceal screening and has been drinking water this morning instead of staying NPO.  ROS : Review of Systems  Respiratory: Negative for cough and shortness of breath.   Gastrointestinal: Positive for diarrhea. Negative for abdominal pain, blood in stool, constipation, heartburn, melena, nausea and vomiting.   Objective: Vital signs in last 24 hours: Vitals:   07/05/20 0507 07/05/20 0520  BP: 115/75 102/74  Pulse: (!) 112 105  Resp: 16 16  Temp: 98.6 F (37 C) 99.2 F (37.3 C)  SpO2: 97% 98%    Physical Exam:  General:  Lethargic, chronically ill-appearing, thin, oriented, cooperative, no acute distress  Head:  Normocephalic, without obvious abnormality, atraumatic  Eyes:  Icteric sclera, EOMs intact  Lungs:   Clear to auscultation bilaterally, respirations unlabored  Heart:  Mildly tachycardic with regular rate and rhythm, S1, S2 normal  Abdomen:   Soft, non-tender, mildly distended, bowel sounds active all four quadrants, no guarding or peritoneal signs   Extremities: Extremities normal, atraumatic, no  edema  Pulses: 2+ and symmetric    Lab Results: Recent Labs    07/02/20 1708 07/02/20 1739 07/03/20 1346 07/03/20 1346 07/04/20 0523 07/05/20 0515  NA   < >  --  131*   < > 132* 132*  K   < >  --  3.5   < > 3.2* 3.9  CL   < >  --  97*   < > 99 97*  CO2   < >  --  25   < > 24 25  GLUCOSE   < >  --  124*   < > 99 106*  BUN   < >  --  6   < > <5* <5*  CREATININE   < >  --  0.58*   < > 0.54* 0.63  CALCIUM   < >  --  7.8*   < > 7.9* 8.0*  MG  --  1.9 1.9  --   --   --   PHOS  --  3.7 3.2  --   --   --    < > = values in this interval not displayed.   Recent Labs    07/04/20 0523  07/05/20 0515  AST 46* 53*  ALT 19 21  ALKPHOS 89 117  BILITOT 6.7* 5.3*  PROT 5.6* 6.1*  ALBUMIN 2.5* 2.5*   Recent Labs    07/02/20 1708 07/02/20 1708 07/03/20 1346 07/03/20 1346 07/04/20 0523 07/05/20 0515  WBC 16.2*   < > 12.8*   < > 10.6* 13.6*  NEUTROABS 12.7*  --  9.3*  --   --   --   HGB 10.9*   < > 9.9*   < > 8.5* 9.0*  HCT 32.8*   < > 29.1*   < > 25.6* 26.8*  MCV 103.1*   < > 103.2*   < > 104.5* 103.9*  PLT 180   < > 147*   < > 127* 125*   < > = values in this interval not displayed.   Recent Labs    07/03/20 1540 07/04/20 0523  LABPROT 29.4* 29.3*  INR 2.9* 2.9*    Assessment: Decompensated alcoholic cirrhosis with ascites. MELD score  of 28 as of 07/04/20.  -T. Bili 5.3/AST 53/ALT 21/ALP 117 as compared to yesterday T bili 6.7/AST 46/ALT 19/ALP 89  -Platelets 125K/uL today -INR 2.9 07/04/20, given 10 mg Vitamin K today -Paracentesis yesterday with 4L removed, cytology pending but negative culture, albumin <1, consistent with portal HTN -Normal AFP, normal ferritin, mild elevated iron saturation at 64, normal ANA and antimitochondrial antibody and  negative hepatitis panel on June 12, 2020.  Hepatic encephalopathy, improved on lactulose.  He is alert and oriented x3 today  Leukocytosis, chronic, 13.6 today  Macrocytic anemia: mild folate deficiency, mildly low serum iron (30) but normal ferritin.  No signs of GI bleeding.  Plan: Patient refused EGD today for variceal screening, stating he has "been in the hospital too many days."  Discussed risk of spontaneous variceal bleeding, which could lead to death in minutes to hours.  Advised that EGD will aid in identification of varices, with possible banding, and give guidance on whether or not patient requires beta blocker.  Patient still refused EGD despite this education.  Increase spironolactone to 100 mg daily.  Continue Lasix 40 mg daily.  Continue lactulose titrated for 2-3 soft BMs per day.  Absolute  alcohol abstinence re-discussed.  Educated patient on 2g sodium restricted diet.  Advised him to avoid/use caution with canned/processed foods, as these tend to be high in sodium.  Outpatient screening colonoscopy due at age 83 due to family history of colon cancer (mother, age 83)  Recommend follow-up with Dr. Levora Angel at [facility]; patient given office contact information.  OK to discharge from a GI standpoint.  [facility] will sign off.  Please contact us if we can be of any further assistance during this hospital stay.  Edrick Kins PA-C 07/05/2020, 9:04 AM  Contact #  [phone]

## 2020-07-05 NOTE — Discharge Summary (Signed)
Physician Discharge Summary  Matthew DaysChristopher Zackery WUJ:[id number] DOB: Apr 22, 1986 DOA: 07/02/2020  PCP: Patient, No Pcp Per  Admit date: 07/02/2020 Discharge date: 07/05/2020  Admitted From: Home Disposition: Home   Recommendations for Outpatient Follow-up:  1. Follow up with PCP in 1-2 weeks 2. Alcohol cessation counseling 3. Recommend GI follow up ASAP.  Home Health: None Equipment/Devices: None Discharge Condition: stable  CODE STATUS: Full Diet recommendation: Sodium restriction  Brief/Interim Summary: Matthew [name] is a 34 y.o. male with history of heavy alcohol abuse, alcoholic cirrhosis with portal hypertension, ascites was hospitalized 2 weeks ago with decompensated cirrhosis, underwent paracentesis at the time which was negative for SBP, subsequently left AMA.  Subsequently developed worsening swelling, abdominal distention, reportedly his family was upset that he continued to drink, had been confused and unable to take care of himself and initiated IVC paperwork, and subsequently brought to the emergency room.  Family reported patient continuing to drink although he reports abstinence from EtOH since 5/21.  Work-up in the ED noted bilirubin of 8.6, white count of 21K, PT of 24.8. Paracentesisi was performed. GI consulted and recommended EGD which the patient refused. His mentation appears clear and he has been cleared by psychiatry having been deemed capable of making his own medical decisions.   Discharge Diagnoses:  Principal Problem:   Alcohol use disorder, severe, in early remission [facility](HCC) Active Problems:   Ascites of liver   Hepatic cirrhosis (HCC)   Leukocytosis   Macrocytic anemia   Hepatic encephalopathy (HCC)   Tobacco abuse   Protein-calorie malnutrition, severe  Decompensated alcoholic liver cirrhosis Hepatic encephalopathy Recurrent ascites -Recently hospitalized for same, left AMA on 7/7 -Underwent large-volume paracentesis 7/25, 4 L of amber-colored  fluid drained, negative for SBP -Duplex ultrasound negative for Budd-Chiari syndrome -Plan for EGD though Patient refused EGD today for variceal screening, stating he has "been in the hospital too many days."  Discussed risk of spontaneous variceal bleeding, which could lead to death in minutes to hours.  Advised that EGD will aid in identification of varices, with possible banding, and give guidance on whether or not patient requires beta blocker.  Patient still refused EGD despite this education.  Increase spironolactone to 100 mg daily.  Continue Lasix 40 mg daily.  Continue lactulose titrated for 2-3 soft BMs per day.  Absolute alcohol abstinence re-discussed.  Educated patient on 2g sodium restricted diet.  Advised him to avoid/use caution with canned/processed foods, as these tend to be high in sodium.  Outpatient screening colonoscopy due at age 34 due to family history of colon cancer (mother, age 36)  Recommend follow-up with Dr. Levora AngelBrahmbhatt at [facility]; patient given office contact information.  Leukocytosis -This appears to be somewhat chronic, he is afebrile, ascitic fluid negative for SBP -Improving, monitor  Hypokalemia -replace  Macrocytic anemia -likely secondary to alcoholism, anemia panel unremarkable  ETOH abuse -Counseled, reportedly quit on 5/21, which contradicts what family reported on admission  Tobacco abuse -Counseled  History of IVC: Psychiatry consulted:  Plan Psychiatrically clear; Order peer support and social work to set up with outpatient psychiatric services.  Disposition:  Psychiatrically cleared No evidence of imminent risk to self or others at present.   Patient does not meet criteria for psychiatric inpatient admission. Supportive therapy provided about ongoing stressors. Refer to IOP. Discussed crisis plan, support from social network, calling 911, coming to the Emergency Department, and calling Suicide Hotline. - Therefore, if  patient did have IVC, it has been rescinded.   Discharge  Instructions Discharge Instructions    Diet - low sodium heart healthy   Complete by: As directed    Discharge instructions   Complete by: As directed    You were admitted and treated for liver failure which caused recurrent ascites (fluid in the abdomen). You are at high risk of spontaneous variceal bleeding from blood vessels around the esophagus and/or stomach, which could lead to death in minutes to hours. You were advised that EGD will aid in identification of varices, with possible banding, and give guidance on further management. You still refused EGD despite this education.  Continue spironolactone to 100 mg, Lasix 40 mg daily. Continue lactulose titrated for 2-3 soft BMs per day. Continue taking folic acid and thiamine supplements. Absolute alcohol abstinence is necessary to stop progression of liver failure. Follow a 2g/day sodium restricted diet.   Outpatient screening colonoscopy due at age 34 due to family history of colon cancer (mother, age 23)  Recommend follow-up with Dr. Levora Angel at [facility]. Please call to schedule this right away. EGD remains recommended.  History of IVC: Psychiatry consulted, felt IVC was not appropriate at this time, strongly recommend outpatient psychiatry services.     Allergies as of 07/05/2020   No Known Allergies     Medication List    TAKE these medications   ADULT ONE DAILY GUMMIES PO Take 1 tablet by mouth every evening. CBD   folic acid 1 MG tablet Commonly known as: FOLVITE Take 1 tablet (1 mg total) by mouth daily. Start taking on: July 06, 2020   furosemide 40 MG tablet Commonly known as: LASIX Take 1 tablet (40 mg total) by mouth daily. Start taking on: July 06, 2020   lactulose 10 GM/15ML solution Commonly known as: CHRONULAC Take 30 mLs (20 g total) by mouth 3 (three) times daily.   spironolactone 100 MG tablet Commonly known as: ALDACTONE Take 1 tablet  (100 mg total) by mouth daily. Start taking on: July 06, 2020   thiamine 100 MG tablet Take 1 tablet (100 mg total) by mouth daily. Start taking on: July 06, 2020       Follow-up Information    Kathi Der, MD. Call.   Specialty: Gastroenterology Why: Call office to make an appointment for hospital follow-up of cirrhosis of the liver. Contact information: [address] [place] [address] [phone]              No Known Allergies  Consultations:  [facility]  Psychiatry  Procedures/Studies: US Abdomen Complete  Result Date: 06/12/2020 CLINICAL DATA:  Abdominal pain elevated liver enzymes. EXAM: ABDOMEN ULTRASOUND COMPLETE COMPARISON:  Non FINDINGS: Gallbladder: Sludge with small stones in the gallbladder lumen. Gallbladder wall thickening to nearly 7 mm. No reported tenderness over the gallbladder. Diffuse ascites. Common bile duct: Diameter: 3.3 mm Liver: Heterogeneous echotexture with nodular hepatic contours. Small anechoic lesion 0.6 x 0.5 x 0.5 cm in the medial segment of the LEFT hepatic lobe. No additional lesions on limited assessment. Portal vein is patent on color Doppler imaging with normal direction of blood flow towards the liver. IVC: Grossly normal within visualized portions. Pancreas: Pancreas not well seen along with midline structures. Spleen: Spleen is enlarged measuring approximately 15 cm greatest dimension. Right Kidney: Length: 11.1 cm. Limited assessment without hydronephrosis. Left Kidney: Length: 10.1 cm. Anechoic cyst in the interpolar LEFT kidney 0.7 x 0.7 x 1.2 cm Abdominal aorta: No aneurysm visualized. Distal aorta and iliacs not well seen obscured by bowel  gas. Other findings: Large volume ascites. IMPRESSION: 1. Findings of hepatic cirrhosis and portal hypertension with ascites and splenomegaly. 2. Ascites and liver dysfunction gallbladder wall thickening is nonspecific, essentially equivocal for acute gallbladder pathology. There  is also suggestion of some bowel edema which can be seen in the setting of portal hypertension. If there is continued concern for gallbladder pathology HIDA scan may be helpful. MRI and or MRCP is likely to be challenging in this patient with diffuse ascites and without signs of common bile duct dilation at this time. 3. Furthermore, imaging of the liver on follow-up with CT may be warranted given suggestion of advanced, chronic and decompensated liver disease. Electronically Signed   By: Donzetta Kohut M.D.   On: 06/12/2020 18:19   CT ABDOMEN PELVIS W CONTRAST  Result Date: 06/13/2020 CLINICAL DATA:  Ascites with diffuse abdominal pain and nausea. EXAM: CT ABDOMEN AND PELVIS WITH CONTRAST TECHNIQUE: Multidetector CT imaging of the abdomen and pelvis was performed using the standard protocol following bolus administration of intravenous contrast. CONTRAST:  OMNIPAQUE IOHEXOL 300 MG/ML  SOLN COMPARISON:  Abdominal ultrasound dated 06/12/2020. FINDINGS: Lower chest: Moderate bilateral pleural effusions with associated atelectasis are partially imaged. Hepatobiliary: The liver is heterogeneous with a nodular surface contour, consistent with hepatic cirrhosis. A 4 mm hypoattenuating lesion in hepatic segment 4A likely represents a benign cyst which was seen on prior ultrasound. A gallstone versus layering sludge is seen in the gallbladder neck. No gallbladder wall thickening or biliary dilatation. Pancreas: Unremarkable. No pancreatic ductal dilatation or surrounding inflammatory changes. Spleen: Enlarged without focal abnormality. Adrenals/Urinary Tract: Adrenal glands are unremarkable. Kidneys are normal, without renal calculi, focal lesion, or hydronephrosis. Bladder is nondistended. Stomach/Bowel: Stomach is within normal limits. Enteric contrast reaches the rectum. Appendix appears normal. No evidence of bowel wall thickening, distention, or inflammatory changes. Vascular/Lymphatic: The middle and left  hepatic veins are not well seen which may be related to the phase of contrast. No enlarged abdominal or pelvic lymph nodes. Reproductive: Prostate is unremarkable. Other: No abdominal wall hernia or abnormality. Large volume ascites. Musculoskeletal: No acute or significant osseous findings. IMPRESSION: 1. The middle and left hepatic veins are not seen which may be related to the phase of contrast, however an abdominal ultrasound with liver Doppler could be considered to assess the hepatic veins (Budd-Chiari syndrome). 2. 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 3. Moderate bilateral pleural effusions with associated atelectasis. 4. Cholelithiasis versus layering sludge in the gallbladder neck. Electronically Signed   By: Romona Curls M.D.   On: 06/13/2020 13:53   US Paracentesis  Result Date: 07/03/2020 INDICATION: Patient with a history of alcoholic cirrhosis and recurrent ascites. Interventional radiology asked to perform a therapeutic and diagnostic paracentesis. EXAM: ULTRASOUND GUIDED PARACENTESIS MEDICATIONS: 1% lidocaine 10 mL COMPLICATIONS: None immediate. PROCEDURE: Informed written consent was obtained from the patient after a discussion of the risks, benefits and alternatives to treatment. A timeout was performed prior to the initiation of the procedure. Initial ultrasound scanning demonstrates a large amount of ascites within the right lower abdominal quadrant. The right lower abdomen was prepped and draped in the usual sterile fashion. 1% lidocaine was used for local anesthesia. Following this, a 19 gauge, 7-cm, Yueh catheter was introduced. An ultrasound image was saved for documentation purposes. The paracentesis was performed. The catheter was removed and a dressing was applied. The patient tolerated the procedure well without immediate post procedural complication. FINDINGS: A total of approximately 4 L of dark yellow  fluid was removed. Samples were sent to the laboratory as  requested by the clinical team. IMPRESSION: Successful ultrasound-guided paracentesis yielding 4 liters of peritoneal fluid. Read by: Alwyn Ren, NP Electronically Signed   By: Corlis Leak M.D.   On: 07/03/2020 14:11   US Paracentesis  Result Date: 06/14/2020 INDICATION: Patient with history of alcohol abuse, new onset ascites. Request to IR for diagnostic and therapeutic paracentesis. EXAM: ULTRASOUND GUIDED DIAGNOSTIC AND THERAPEUTIC PARACENTESIS MEDICATIONS: 10 mL% lidocaine COMPLICATIONS: None immediate. PROCEDURE: Informed written consent was obtained from the patient after a discussion of the risks, benefits and alternatives to treatment. A timeout was performed prior to the initiation of the procedure. Initial ultrasound scanning demonstrates a large amount of ascites within the left lower abdominal quadrant. The left lower abdomen was prepped and draped in the usual sterile fashion. 1% lidocaine was used for local anesthesia. Following this, a 19 gauge, 7-cm, Yueh catheter was introduced. An ultrasound image was saved for documentation purposes. The paracentesis was performed. The catheter was removed and a dressing was applied. The patient tolerated the procedure well without immediate post procedural complication. FINDINGS: A total of approximately 4.0 L of hazy yellow fluid was removed. Samples were sent to the laboratory as requested by the clinical team. IMPRESSION: Successful ultrasound-guided paracentesis yielding 4.0 liters of peritoneal fluid. Read by Lynnette Caffey, PA-C Electronically Signed   By: Malachy Moan M.D.   On: 06/14/2020 15:39   DG CHEST PORT 1 VIEW  Result Date: 07/02/2020 CLINICAL DATA:  Hepatic encephalopathy EXAM: PORTABLE CHEST 1 VIEW COMPARISON:  None FINDINGS: Low lung volumes and streaky basilar atelectasis. No consolidation, features of edema, pneumothorax, or effusion. Pulmonary vascularity is normally distributed. The cardiomediastinal contours are  unremarkable. No acute osseous or soft tissue abnormality. IMPRESSION: Low volumes and atelectasis without other acute cardiopulmonary abnormality. Electronically Signed   By: Kreg Shropshire M.D.   On: 07/02/2020 20:02   US LIVER DOPPLER  Result Date: 07/03/2020 CLINICAL DATA:  Ascites, rule out Budd-Chiari syndrome EXAM: DUPLEX ULTRASOUND OF LIVER TECHNIQUE: Color and duplex Doppler ultrasound was performed to evaluate the hepatic in-flow and out-flow vessels. COMPARISON:  CT 06/13/2020 FINDINGS: Liver: Normal parenchymal echogenicity. Minimally nodular hepatic contour. No biliary ductal dilatation.  0.8 cm probable cyst, left lobe. Main Portal Vein size: 0.9 cm Portal Vein Velocities (all hepatopetal): Main Prox:  22 cm/sec Main Mid: 28 cm/sec Main Dist:  21 cm/sec Right: 24 cm/sec Left: 10 cm/sec Hepatic Vein Velocities (all hepatofugal): Right:  72 cm/sec Middle:  88 cm/sec Left:  39 cm/sec IVC: Patent with normal color Doppler flow signal Hepatic Artery Velocity:  233 cm/sec Splenic Vein Velocity:  46 cm/sec Spleen: 9.4 cm x 4.5 cm x 13 cm with a total volume of 287 cm^3 (411 cm^3 is upper limit normal) Portal Vein Occlusion/Thrombus: No Splenic Vein Occlusion/Thrombus: No Ascites: Present, moderate to large volume Varices: None identified IMPRESSION: 1. Unremarkable hepatic vascular Doppler evaluation. No evidence of Budd-Chiari syndrome. 2. Abdominal ascites Electronically Signed   By: Corlis Leak M.D.   On: 07/03/2020 11:43     Subjective: Wants to go home. Drinking water regardless of recommendation for NPO. Wants resources for alcohol cessation. Feels no fever or abdominal pain.  Discharge Exam: Vitals:   07/05/20 0507 07/05/20 0520  BP: 115/75 102/74  Pulse: (!) 112 105  Resp: 16 16  Temp: 98.6 F (37 C) 99.2 F (37.3 C)  SpO2: 97% 98%   General: Pt is alert, awake, not in  acute distress Cardiovascular: RRR, S1/S2 +, no rubs, no gallops Respiratory: CTA bilaterally, no wheezing, no  rhonchi Abdominal: Soft, NT, dullness to percussion, bowel sounds + Extremities: No pitting LE edema, no cyanosis  Labs: BNP (last 3 results) No results for input(s): BNP in the last 8760 hours. Basic Metabolic Panel: Recent Labs  Lab 07/02/20 1708 07/02/20 1739 07/03/20 1346 07/04/20 0523 07/05/20 0515  NA 130*  --  131* 132* 132*  K 3.7  --  3.5 3.2* 3.9  CL 96*  --  97* 99 97*  CO2 20*  --  GLUCOSE 126*  --  124* 99 106*  BUN 6  --  6 <5* <5*  CREATININE 0.58*  --  0.58* 0.54* 0.63  CALCIUM 8.5*  --  7.8* 7.9* 8.0*  MG  --  1.9 1.9  --   --   PHOS  --  3.7 3.2  --   --    Liver Function Tests: Recent Labs  Lab 07/02/20 1708 07/03/20 1346 07/04/20 0523 07/05/20 0515  AST 66* 56* 46* 53*  ALT ALKPHOS 122 103 89 117  BILITOT 8.6* 8.0* 6.7* 5.3*  PROT 7.0 6.0* 5.6* 6.1*  ALBUMIN 2.2* 2.0* 2.5* 2.5*   No results for input(s): LIPASE, AMYLASE in the last 168 hours. Recent Labs  Lab 07/02/20 1708 07/03/20 0500  AMMONIA 77* 38*   CBC: Recent Labs  Lab 07/02/20 1708 07/03/20 1346 07/04/20 0523 07/05/20 0515  WBC 16.2* 12.8* 10.6* 13.6*  NEUTROABS 12.7* 9.3*  --   --   HGB 10.9* 9.9* 8.5* 9.0*  HCT 32.8* 29.1* 25.6* 26.8*  MCV 103.1* 103.2* 104.5* 103.9*  PLT 180 147* 127* 125*   Cardiac Enzymes: No results for input(s): CKTOTAL, CKMB, CKMBINDEX, TROPONINI in the last 168 hours. BNP: Invalid input(s): POCBNP CBG: Recent Labs  Lab 07/03/20 1250 07/03/20 1322  GLUCAP 40* 150*   D-Dimer No results for input(s): DDIMER in the last 72 hours. Hgb A1c No results for input(s): HGBA1C in the last 72 hours. Lipid Profile No results for input(s): CHOL, HDL, LDLCALC, TRIG, CHOLHDL, LDLDIRECT in the last 72 hours. Thyroid function studies Recent Labs    07/03/20 1346  TSH 4.042   Anemia work up Recent Labs    07/04/20 0523  VITAMINB12 1,293*  FOLATE 8.4  FERRITIN 271  TIBC 85*  IRON 30*  RETICCTPCT 2.3   Urinalysis     Component Value Date/Time   COLORURINE AMBER (A) 07/03/2020 1145   APPEARANCEUR CLEAR 07/03/2020 1145   LABSPEC 1.029 07/03/2020 1145   PHURINE 5.0 07/03/2020 1145   GLUCOSEU 50 (A) 07/03/2020 1145   HGBUR NEGATIVE 07/03/2020 1145   BILIRUBINUR MODERATE (A) 07/03/2020 1145   KETONESUR 5 (A) 07/03/2020 1145   PROTEINUR 30 (A) 07/03/2020 1145   NITRITE NEGATIVE 07/03/2020 1145   LEUKOCYTESUR NEGATIVE 07/03/2020 1145    Microbiology Recent Results (from the past 240 hour(s))  SARS Coronavirus 2 by RT PCR (hospital order, performed in [facility] hospital lab) Nasopharyngeal Nasopharyngeal Swab     Status: None   Collection Time: 07/02/20  6:46 PM   Specimen: Nasopharyngeal Swab  Result Value Ref Range Status   SARS Coronavirus 2 NEGATIVE NEGATIVE Final    Comment: (NOTE) SARS-CoV-2 target nucleic acids are NOT DETECTED.  The SARS-CoV-2 RNA is generally detectable in upper and lower respiratory specimens during the acute phase of infection. The lowest concentration of SARS-CoV-2 viral copies this assay  can detect is 250 copies / mL. A negative result does not preclude SARS-CoV-2 infection and should not be used as the sole basis for treatment or other patient management decisions.  A negative result may occur with improper specimen collection / handling, submission of specimen other than nasopharyngeal swab, presence of viral mutation(s) within the areas targeted by this assay, and inadequate number of viral copies (<250 copies / mL). A negative result must be combined with clinical observations, patient history, and epidemiological information.  Fact Sheet for Patients:   BoilerBrush.com.cy  Fact Sheet for Healthcare Providers: https://pope.com/  This test is not yet approved or  cleared by the [place] FDA and has been authorized for detection and/or diagnosis of SARS-CoV-2 by FDA under an Emergency Use Authorization  (EUA).  This EUA will remain in effect (meaning this test can be used) for the duration of the COVID-19 declaration under Section 564(b)(1) of the Act, 21 U.S.C. section 360bbb-3(b)(1), unless the authorization is terminated or revoked sooner.  Performed at [facility], [address], [place], [place] [address]   Body fluid culture     Status: None (Preliminary result)   Collection Time: 07/03/20  2:28 PM   Specimen: PATH Cytology Peritoneal fluid  Result Value Ref Range Status   Specimen Description   Final    PERITONEAL Performed at [facility], [address] [place], [place] [address]    Special Requests   Final    CYTO Performed at [facility], [address] [place], [place] [address]    Gram Stain   Final    NO ORGANISMS SEEN WBC PRESENT, PREDOMINANTLY MONONUCLEAR CYTOSPIN SMEAR NOTIFIED ZULETTA,K RN @1553  ON 07/03/20 JACKSON,K Performed at [facility], [address] [place], [place]    Culture   Final    NO GROWTH 2 DAYS Performed at [facility], [address], [place], [place]    Report Status PENDING  Incomplete    Time coordinating discharge: Approximately 40 minutes  [id number], MD  [facility] 07/05/2020, 7:40 PM

## 2020-07-07 LAB — BODY FLUID CULTURE
Culture: NO GROWTH
Gram Stain: NONE SEEN

## 2020-07-25 ENCOUNTER — Inpatient Hospital Stay (HOSPITAL_COMMUNITY)
Admission: EM | Admit: 2020-07-25 | Discharge: 2020-07-28 | DRG: 432 | Disposition: A | Discharge status: Home / Self Care | Payer: Self-pay | Attending: Internal Medicine | Admitting: Internal Medicine

## 2020-07-25 ENCOUNTER — Emergency Department (HOSPITAL_COMMUNITY): Payer: Self-pay

## 2020-07-25 ENCOUNTER — Other Ambulatory Visit: Payer: Self-pay

## 2020-07-25 ENCOUNTER — Encounter (HOSPITAL_COMMUNITY): Payer: Self-pay | Admitting: Internal Medicine

## 2020-07-25 DIAGNOSIS — D509 Iron deficiency anemia, unspecified: Secondary | ICD-10-CM | POA: Diagnosis present

## 2020-07-25 DIAGNOSIS — D72829 Elevated white blood cell count, unspecified: Secondary | ICD-10-CM

## 2020-07-25 DIAGNOSIS — F1011 Alcohol abuse, in remission: Secondary | ICD-10-CM | POA: Diagnosis present

## 2020-07-25 DIAGNOSIS — K922 Gastrointestinal hemorrhage, unspecified: Secondary | ICD-10-CM | POA: Diagnosis present

## 2020-07-25 DIAGNOSIS — Z800 Family history of malignant neoplasm of digestive organs: Secondary | ICD-10-CM

## 2020-07-25 DIAGNOSIS — I8510 Secondary esophageal varices without bleeding: Secondary | ICD-10-CM

## 2020-07-25 DIAGNOSIS — I8511 Secondary esophageal varices with bleeding: Secondary | ICD-10-CM | POA: Diagnosis present

## 2020-07-25 DIAGNOSIS — F17210 Nicotine dependence, cigarettes, uncomplicated: Secondary | ICD-10-CM | POA: Diagnosis present

## 2020-07-25 DIAGNOSIS — E43 Unspecified severe protein-calorie malnutrition: Secondary | ICD-10-CM | POA: Diagnosis present

## 2020-07-25 DIAGNOSIS — E871 Hypo-osmolality and hyponatremia: Secondary | ICD-10-CM | POA: Diagnosis present

## 2020-07-25 DIAGNOSIS — D649 Anemia, unspecified: Secondary | ICD-10-CM

## 2020-07-25 DIAGNOSIS — R627 Adult failure to thrive: Secondary | ICD-10-CM | POA: Diagnosis present

## 2020-07-25 DIAGNOSIS — R188 Other ascites: Secondary | ICD-10-CM

## 2020-07-25 DIAGNOSIS — K7031 Alcoholic cirrhosis of liver with ascites: Principal | ICD-10-CM

## 2020-07-25 DIAGNOSIS — K3189 Other diseases of stomach and duodenum: Secondary | ICD-10-CM | POA: Diagnosis present

## 2020-07-25 DIAGNOSIS — K766 Portal hypertension: Secondary | ICD-10-CM | POA: Diagnosis present

## 2020-07-25 DIAGNOSIS — Z79899 Other long term (current) drug therapy: Secondary | ICD-10-CM

## 2020-07-25 DIAGNOSIS — Z20822 Contact with and (suspected) exposure to covid-19: Secondary | ICD-10-CM | POA: Diagnosis present

## 2020-07-25 DIAGNOSIS — D62 Acute posthemorrhagic anemia: Secondary | ICD-10-CM | POA: Diagnosis present

## 2020-07-25 LAB — CBC WITH DIFFERENTIAL/PLATELET
Abs Immature Granulocytes: 0.58 10*3/uL — ABNORMAL HIGH (ref 0.00–0.07)
Basophils Absolute: 0 10*3/uL (ref 0.0–0.1)
Basophils Relative: 0 %
Eosinophils Absolute: 0.2 10*3/uL (ref 0.0–0.5)
Eosinophils Relative: 1 %
HCT: 11.3 % — ABNORMAL LOW (ref 39.0–52.0)
Hemoglobin: 4 g/dL — CL (ref 13.0–17.0)
Immature Granulocytes: 2 %
Lymphocytes Relative: 13 %
Lymphs Abs: 4 10*3/uL (ref 0.7–4.0)
MCH: 36.7 pg — ABNORMAL HIGH (ref 26.0–34.0)
MCHC: 35.4 g/dL (ref 30.0–36.0)
MCV: 103.7 fL — ABNORMAL HIGH (ref 80.0–100.0)
Monocytes Absolute: 2.8 10*3/uL — ABNORMAL HIGH (ref 0.1–1.0)
Monocytes Relative: 9 %
Neutro Abs: 23.1 10*3/uL — ABNORMAL HIGH (ref 1.7–7.7)
Neutrophils Relative %: 75 %
Platelets: 169 10*3/uL (ref 150–400)
RBC: 1.09 MIL/uL — ABNORMAL LOW (ref 4.22–5.81)
RDW: 15.8 % — ABNORMAL HIGH (ref 11.5–15.5)
WBC: 30.7 10*3/uL — ABNORMAL HIGH (ref 4.0–10.5)
nRBC: 0 % (ref 0.0–0.2)

## 2020-07-25 LAB — COMPREHENSIVE METABOLIC PANEL
ALT: 41 U/L (ref 0–44)
AST: 83 U/L — ABNORMAL HIGH (ref 15–41)
Albumin: 2.3 g/dL — ABNORMAL LOW (ref 3.5–5.0)
Alkaline Phosphatase: 58 U/L (ref 38–126)
Anion gap: 12 (ref 5–15)
BUN: 45 mg/dL — ABNORMAL HIGH (ref 6–20)
CO2: 24 mmol/L (ref 22–32)
Calcium: 8.3 mg/dL — ABNORMAL LOW (ref 8.9–10.3)
Chloride: 84 mmol/L — ABNORMAL LOW (ref 98–111)
Creatinine, Ser: 0.92 mg/dL (ref 0.61–1.24)
GFR calc Af Amer: >60 mL/min (ref >60)
GFR calc non Af Amer: >60 mL/min (ref >60)
Glucose, Bld: 137 mg/dL — ABNORMAL HIGH (ref 70–99)
Potassium: 4.5 mmol/L (ref 3.5–5.1)
Sodium: 120 mmol/L — ABNORMAL LOW (ref 135–145)
Total Bilirubin: 4.8 mg/dL — ABNORMAL HIGH (ref 0.3–1.2)
Total Protein: 5 g/dL — ABNORMAL LOW (ref 6.5–8.1)

## 2020-07-25 LAB — LIPASE, BLOOD: Lipase: 39 U/L (ref 11–51)

## 2020-07-25 LAB — POC OCCULT BLOOD, ED: Fecal Occult Bld: POSITIVE — AB

## 2020-07-25 LAB — HEMOGLOBIN AND HEMATOCRIT, BLOOD
HCT: 18.5 % — ABNORMAL LOW (ref 39.0–52.0)
Hemoglobin: 6.6 g/dL — CL (ref 13.0–17.0)

## 2020-07-25 LAB — SARS CORONAVIRUS 2 BY RT PCR (HOSPITAL ORDER, PERFORMED IN ~~LOC~~ HOSPITAL LAB): SARS Coronavirus 2: NEGATIVE

## 2020-07-25 LAB — AMMONIA: Ammonia: 37 umol/L — ABNORMAL HIGH (ref 9–35)

## 2020-07-25 LAB — PREPARE RBC (CROSSMATCH)

## 2020-07-25 LAB — PROTIME-INR
INR: 1.9 — ABNORMAL HIGH (ref 0.8–1.2)
Prothrombin Time: 21.5 s — ABNORMAL HIGH (ref 11.4–15.2)

## 2020-07-25 MED ORDER — ONDANSETRON HCL 4 MG PO TABS: 4.0000 mg | ORAL_TABLET | Freq: Four times a day (QID) | ORAL | Status: DC | PRN

## 2020-07-25 MED ORDER — SODIUM CHLORIDE 0.9 % IV SOLN: 250.0000 mL | INTRAVENOUS | Status: DC | PRN

## 2020-07-25 MED ORDER — ONDANSETRON HCL 4 MG/2ML IJ SOLN
4.0000 mg | Freq: Four times a day (QID) | INTRAMUSCULAR | Status: DC | PRN
  Administered 2020-07-26: 4 mg via INTRAVENOUS
  Filled 2020-07-25 (×2): qty 2

## 2020-07-25 MED ORDER — PANTOPRAZOLE SODIUM 40 MG IV SOLR
40.0000 mg | Freq: Two times a day (BID) | INTRAVENOUS | Status: DC
  Administered 2020-07-25 – 2020-07-28 (×6): 40 mg via INTRAVENOUS
  Filled 2020-07-25 (×6): qty 40

## 2020-07-25 MED ORDER — SODIUM CHLORIDE 0.9 % IV SOLN
1.0000 g | Freq: Once | INTRAVENOUS | Status: AC
  Administered 2020-07-25: 1 g via INTRAVENOUS
  Filled 2020-07-25: qty 10

## 2020-07-25 MED ORDER — SODIUM CHLORIDE 0.9% IV SOLUTION: Freq: Once | INTRAVENOUS | Status: AC

## 2020-07-25 MED ORDER — SODIUM CHLORIDE 0.9 % IV SOLN: INTRAVENOUS | Status: AC

## 2020-07-25 MED ORDER — THIAMINE HCL 100 MG/ML IJ SOLN: 100.0000 mg | Freq: Every day | INTRAMUSCULAR | Status: DC

## 2020-07-25 MED ORDER — SODIUM CHLORIDE 0.9 % IV SOLN
25.0000 ug/h | INTRAVENOUS | Status: DC
  Administered 2020-07-25 – 2020-07-27 (×2): 25 ug/h via INTRAVENOUS
  Filled 2020-07-25 (×5): qty 1

## 2020-07-25 MED ORDER — FOLIC ACID 1 MG PO TABS
1.0000 mg | ORAL_TABLET | Freq: Every day | ORAL | Status: DC
  Administered 2020-07-27 – 2020-07-28 (×2): 1 mg via ORAL
  Filled 2020-07-25 (×3): qty 1

## 2020-07-25 MED ORDER — MORPHINE SULFATE (PF) 2 MG/ML IV SOLN
2.0000 mg | INTRAVENOUS | Status: DC | PRN
  Administered 2020-07-26 – 2020-07-27 (×4): 2 mg via INTRAVENOUS
  Filled 2020-07-25 (×4): qty 1

## 2020-07-25 MED ORDER — PANTOPRAZOLE SODIUM 40 MG IV SOLR
40.0000 mg | Freq: Once | INTRAVENOUS | Status: AC
  Administered 2020-07-25: 40 mg via INTRAVENOUS
  Filled 2020-07-25: qty 40

## 2020-07-25 MED ORDER — SODIUM CHLORIDE 0.9 % IV SOLN: INTRAVENOUS | Status: DC

## 2020-07-25 MED ORDER — THIAMINE HCL 100 MG/ML IJ SOLN
100.0000 mg | Freq: Every day | INTRAMUSCULAR | Status: DC
  Administered 2020-07-26 – 2020-07-27 (×2): 100 mg via INTRAVENOUS
  Filled 2020-07-25 (×2): qty 2

## 2020-07-25 MED ORDER — SODIUM CHLORIDE 0.9% FLUSH: 3.0000 mL | INTRAVENOUS | Status: DC | PRN

## 2020-07-25 MED ORDER — SODIUM CHLORIDE 0.9 % IV SOLN
1.0000 g | INTRAVENOUS | Status: DC
  Administered 2020-07-25 – 2020-07-27 (×3): 1 g via INTRAVENOUS
  Filled 2020-07-25 (×3): qty 10

## 2020-07-25 MED ORDER — ADULT MULTIVITAMIN LIQUID CH
15.0000 mL | Freq: Every day | ORAL | Status: DC
  Administered 2020-07-25 – 2020-07-28 (×3): 15 mL via ORAL
  Filled 2020-07-25 (×4): qty 15

## 2020-07-25 MED ORDER — SODIUM CHLORIDE 0.9 % IV SOLN
1.0000 mg | Freq: Once | INTRAVENOUS | Status: DC
  Filled 2020-07-25: qty 0.2

## 2020-07-25 MED ORDER — SODIUM CHLORIDE 0.9% FLUSH
3.0000 mL | Freq: Two times a day (BID) | INTRAVENOUS | Status: DC
  Administered 2020-07-26 – 2020-07-27 (×3): 3 mL via INTRAVENOUS

## 2020-07-25 NOTE — Consult Note (Signed)
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 male recently signed out AMA on 07/05/2020 after being in the hospital for 3 days with history of heavy alcohol use, alcohol-related cirrhosis with portal hypertension, decompensation, paracentesis, no evidence of SBP.  Patient presented to the hospital with fatigue, exertional weakness, multiple episodes of presyncope on standing and 8 to 9 pound water weight loss after being on furosemide, spironolactone and lactulose on discharge from last admission.  Patient states that he has been noticing black tarry stools, intermittently seeing liquid black stools, intermittently has yellow stools. He has also been vomiting coffee-ground emesis on multiple occasions.  No prior endoscopy. History of significant alcohol use for over 10 years, 2-4 drinks of hard liquor on a daily basis for 10 years, last alcohol use reported on May 02, 2020.  His mother had colon cancer in her 60s, no prior colonoscopy.   History reviewed. No pertinent past medical history.  History reviewed. No pertinent surgical history.  Prior to Admission medications   Medication Sig Start Date End Date Taking? Authorizing Provider  folic acid (FOLVITE) 1 MG tablet Take 1 tablet (1 mg total) by mouth daily. 07/06/20  Yes Tyrone Nine, MD  furosemide (LASIX) 40 MG tablet Take 1 tablet (40 mg total) by mouth daily. 07/06/20  Yes Tyrone Nine, MD  lactulose (CHRONULAC) 10 GM/15ML solution Take 30 mLs (20 g total) by mouth 3 (three) times daily. 07/05/20 08/04/20 Yes Tyrone Nine, MD  Multiple Vitamins-Minerals (ADULT ONE DAILY GUMMIES PO) Take 1 tablet by mouth every evening. CBD   Yes [provider]  spironolactone (ALDACTONE) 100 MG tablet Take 1 tablet (100 mg total) by mouth daily. 07/06/20  Yes Tyrone Nine, MD  thiamine 100 MG tablet Take 1 tablet (100 mg total) by mouth daily. 07/06/20  Yes Tyrone Nine, MD    Current Facility-Administered Medications  Medication Dose Route Frequency Provider Last Rate Last Admin  . 0.9 %  sodium chloride infusion   Intravenous Continuous Marinda Elk, MD      . folic acid 1 mg in sodium chloride 0.9 % 50 mL IVPB  1 mg Intravenous Once David Stall, Darin Engels, MD      . octreotide (SANDOSTATIN) 500 mcg in sodium chloride 0.9 % 250 mL (2 mcg/mL) infusion  25 mcg/hr Intravenous Continuous Marinda Elk, MD 12.5 mL/hr at 07/25/20 1544 25 mcg/hr at 07/25/20 1544  . thiamine (B-1) injection 100 mg  100 mg Intravenous Daily Marinda Elk, MD       Current Outpatient Medications  Medication Sig Dispense Refill  . folic acid (FOLVITE) 1 MG tablet Take 1 tablet (1 mg total) by mouth daily. 30 tablet 0  . furosemide (LASIX) 40 MG tablet Take 1 tablet (40 mg total) by mouth daily. 30 tablet 0  . lactulose (CHRONULAC) 10 GM/15ML solution Take 30 mLs (20 g total) by mouth 3 (three) times daily. 2700 mL 0  . Multiple Vitamins-Minerals (ADULT ONE DAILY GUMMIES PO) Take 1 tablet by mouth every evening. CBD    . spironolactone (ALDACTONE) 100 MG tablet Take 1 tablet (100 mg total) by mouth daily. 30 tablet 0  . thiamine 100 MG tablet Take 1 tablet (100 mg total) by mouth daily. 30 tablet 0  Allergies as of 07/25/2020  . (No Known Allergies)    Family History  Problem Relation Age of Onset  . Colon cancer Mother     Social History   Socioeconomic History  . Marital status: Single    Spouse name: Not on file  . Number of children: Not on file  . Years of education: Not on file  . Highest education level: Not on file  Occupational History  . Occupation: not employed  Tobacco Use  . Smoking status: Current Every Day  Smoker    Packs/day: 1.00    Types: Cigarettes  . Smokeless tobacco: Never Used  Vaping Use  . Vaping Use: Never used  Substance and Sexual Activity  . Alcohol use: Yes    Alcohol/week: 5.0 standard drinks    Types: 5 Shots of liquor per week  . Drug use: Never  . Sexual activity: Not Currently  Other Topics Concern  . Not on file  Social History Narrative  . Not on file   Social Determinants of Health   Financial Resource Strain:   . Difficulty of Paying Living Expenses:   Food Insecurity:   . Worried About Programme researcher, broadcasting/film/video in the Last Year:   . Barista in the Last Year:   Transportation Needs:   . Freight forwarder (Medical):   Marland Kitchen Lack of Transportation (Non-Medical):   Physical Activity:   . Days of Exercise per Week:   . Minutes of Exercise per Session:   Stress:   . Feeling of Stress :   Social Connections:   . Frequency of Communication with Friends and Family:   . Frequency of Social Gatherings with Friends and Family:   . Attends Religious Services:   . Active Member of Clubs or Organizations:   . Attends Banker Meetings:   Marland Kitchen Marital Status:   Intimate Partner Violence:   . Fear of Current or Ex-Partner:   . Emotionally Abused:   Marland Kitchen Physically Abused:   . Sexually Abused:     Review of Systems: Positive for: GI: Described in detail in HPI.    Gen: involuntary weight loss, denies any fever, chills, rigors, night sweats, anorexia, fatigue, weakness, malaise and sleep disorder CV: Denies chest pain, angina, palpitations, syncope, orthopnea, PND, peripheral edema, and claudication. Resp: Denies dyspnea, cough, sputum, wheezing, coughing up blood. GU : Denies urinary burning, blood in urine, urinary frequency, urinary hesitancy, nocturnal urination, and urinary incontinence. MS: Denies joint pain or swelling.  Denies muscle weakness, cramps, atrophy.  Derm: Denies rash, itching, oral ulcerations, hives, unhealing ulcers.  Psych:  Denies depression, anxiety, memory loss, suicidal ideation, hallucinations,  and confusion. Heme: Denies bruising and enlarged lymph nodes. Neuro:  Dizziness, denies any headaches,  paresthesias. Endo:  Denies any problems with DM, thyroid, adrenal function.  Physical Exam: Vital signs in last 24 hours: Temp:  [97.6 F (36.4 C)-97.9 F (36.6 C)] 97.6 F (36.4 C) (08/16 1355) Pulse Rate:  [98-120] 100 (08/16 1530) Resp:  [11-24] 18 (08/16 1530) BP: (98-122)/(46-63) 115/57 (08/16 1530) SpO2:  [98 %-100 %] 99 % (08/16 1530)    General:   Alert,  Well-developed, thinly built, pleasant and cooperative in NAD Head:  Normocephalic and atraumatic. Eyes:  Sclera clear, no icterus.   Prominent pallor Ears:  Normal auditory acuity. Nose:  No deformity, discharge,  or lesions. Mouth:  No deformity or lesions.  Oropharynx pink & moist. Neck:  Supple; no masses or thyromegaly. Lungs:  Clear throughout to auscultation.   No wheezes, crackles, or rhonchi. No acute distress. Heart:  Regular rate and rhythm; no murmurs, clicks, rubs,  or gallops. Extremities:  Without edema. Neurologic:  Alert and  oriented x4;  grossly normal neurologically. Skin:  Intact without significant lesions or rashes. Psych:  Alert and cooperative. Normal mood and affect. Abdomen:  Soft, nontender and nondistended. No masses, hepatosplenomegaly or hernias noted. Normal bowel sounds, without guarding, and without rebound.         Lab Results: Recent Labs    07/25/20 1221  WBC 30.7*  HGB 4.0*  HCT 11.3*  PLT 169   BMET Recent Labs    07/25/20 1221  NA 120*  K 4.5  CL 84*  CO2 24  GLUCOSE 137*  BUN 45*  CREATININE 0.92  CALCIUM 8.3*   LFT Recent Labs    07/25/20 1221  PROT 5.0*  ALBUMIN 2.3*  AST 83*  ALT 41  ALKPHOS 58  BILITOT 4.8*   PT/INR No results for input(s): LABPROT, INR in the last 72 hours.  Studies/Results: DG Chest Port 1 View  Result Date: 07/25/2020 CLINICAL DATA:  Syncope  EXAM: PORTABLE CHEST 1 VIEW COMPARISON:  July 02, 2020 FINDINGS: The lungs are clear. The heart size and pulmonary vascularity are normal. No adenopathy. No pneumothorax. No bone lesions. IMPRESSION: Lungs clear.  Cardiac silhouette within normal limits. Electronically Signed   By: Bretta Bang III M.D.   On: 07/25/2020 13:07    Impression: Severe symptomatic anemia, hemoglobin 4 on presentation(was 9 on 07/05/2020), intermittent coffee-ground emesis and melena Recently diagnosed alcohol-related cirrhosis, decompensated, ascites status post paracentesis-no edema or ascites noted, was discharged on furosemide 40 mg and spironolactone 100 mg a day. Was on lactulose, no evidence of encephalopathy noted  Leukocytosis, WBC 30.7 Hyponatremia, sodium 120  Plan: First unit PRBC being transfused, plan to transfuse 2 unit PRBC. Monitor H&H and keep hemoglobin above 7. Agree with IV octreotide drip Patient will be started on Protonix 40 mg IV every 12 hours. He was given ceftriaxone 1 g IV x1, will likely need for that to be continued while he is in the hospital. Continue folic acid and thiamine, will add multivitamin. Keep on clear liquid diet and keep n.p.o. post midnight for EGD with possible banding for esophageal varices in a.m. tomorrow. PT/INR not available on this admission, was 29.3 and 2.9 on 06/2620, will order for PT/INR for [facility] calculation    LOS: 0 days   Kerin Salen, MD  07/25/2020, 4:40 PM

## 2020-07-25 NOTE — H&P (Addendum)
History and Physical  Matthew [name] VWP:[id number] DOB: 1986/10/10 DOA: 07/25/2020  PCP: Patient, No Pcp Per Patient coming from: Home I have personally briefly reviewed patient's old medical records in [facility]   Chief Complaint: lower gi bleed  HPI: Matthew [name] is a 34 y.o. male past medical history significant for alcoholic cirrhosis, alcoholic with his last drink according to him in May presented to [facility] long hospital and left AMA on 07/05/2020 and he refused endoscopy variceal bleed during this time he has been taking his medication is lost about 8 pounds and his ascites has improved, presented for feeling generalized weakness he relates his symptoms started on 07/22/2020 as he went to the bathroom and had 1 episode of vomiting but no blood but he did had a bowel movement with very dark and copious amounts, since then every time he stands up he feels lightheaded he denies any NSAID.  In the ED: He was found to be tachycardic and hypotensive he was fluid resuscitated and being transfused 2 units of packed red blood cells, 1 was 4 (on 07/04/2020 it was 9), mildly hyponatremic and with a rise in his creatinine.  ER has been consulted for endoscopy possibly today.  Review of Systems: All systems reviewed and apart from history of presenting illness, are negative.  No past medical history on file. No past surgical history on file. Social History:  reports that he has been smoking cigarettes. He has been smoking about 0.25 packs per day. He has never used smokeless tobacco. He reports current alcohol use. He reports that he does not use drugs.   No Known Allergies  Family History  Problem Relation Age of Onset  . Colon cancer Mother      Prior to Admission medications   Medication Sig Start Date End Date Taking? Authorizing Provider  folic acid (FOLVITE) 1 MG tablet Take 1 tablet (1 mg total) by mouth daily. 07/06/20  Yes Tyrone Nine, MD  furosemide (LASIX) 40  MG tablet Take 1 tablet (40 mg total) by mouth daily. 07/06/20  Yes Tyrone Nine, MD  lactulose (CHRONULAC) 10 GM/15ML solution Take 30 mLs (20 g total) by mouth 3 (three) times daily. 07/05/20 08/04/20 Yes Tyrone Nine, MD  Multiple Vitamins-Minerals (ADULT ONE DAILY GUMMIES PO) Take 1 tablet by mouth every evening. CBD   Yes [provider]  spironolactone (ALDACTONE) 100 MG tablet Take 1 tablet (100 mg total) by mouth daily. 07/06/20  Yes Tyrone Nine, MD  thiamine 100 MG tablet Take 1 tablet (100 mg total) by mouth daily. 07/06/20  Yes Tyrone Nine, MD   Physical Exam: Vitals:   07/25/20 1300 07/25/20 1328 07/25/20 1330 07/25/20 1355  BP: (!) 105/51 (!) 106/50 (!) 101/46 (!) 105/49  Pulse: (!) 109 (!) 109 (!) 109 (!) 106  Resp: 15 14 12 16   Temp:  97.9 F (36.6 C)  97.6 F (36.4 C)  TempSrc:  Oral  Oral  SpO2: 100% 99% 100% 98%     General exam: Malnourished cachectic with an awkward behavior  Head, eyes and ENT: Nontraumatic and normocephalic. Pupils equally reacting to light and accommodation. Oral mucosa moist.  Neck: Supple. No JVD, carotid bruit or thyromegaly.  Lymphatics: No lymphadenopathy.  Respiratory system: Clear to auscultation. No increased work of breathing.  Cardiovascular system: S1 and S2 heard, RRR. No JVD, murmurs, gallops, clicks or pedal edema.  Gastrointestinal system: Bowel sounds soft nontender nondistended  Extremities: Symmetric 5 x  5 power. Peripheral pulses symmetrically felt.   Skin: No rashes or acute findings.  Musculoskeletal system: Negative exam.  Psychiatry: Pleasant and cooperative.   Labs on Admission:  Basic Metabolic Panel: Recent Labs  Lab 07/25/20 1221  NA 120*  K 4.5  CL 84*  CO2 24  GLUCOSE 137*  BUN 45*  CREATININE 0.92  CALCIUM 8.3*   Liver Function Tests: Recent Labs  Lab 07/25/20 1221  AST 83*  ALT 41  ALKPHOS 58  BILITOT 4.8*  PROT 5.0*  ALBUMIN 2.3*   Recent Labs  Lab 07/25/20 1221   LIPASE 39   Recent Labs  Lab 07/25/20 1221  AMMONIA 37*   CBC: Recent Labs  Lab 07/25/20 1221  WBC 30.7*  NEUTROABS 23.1*  HGB 4.0*  HCT 11.3*  MCV 103.7*  PLT 169   Cardiac Enzymes: No results for input(s): CKTOTAL, CKMB, CKMBINDEX, TROPONINI in the last 168 hours.  BNP (last 3 results) No results for input(s): PROBNP in the last 8760 hours. CBG: No results for input(s): GLUCAP in the last 168 hours.  Radiological Exams on Admission: DG Chest Port 1 View  Result Date: 07/25/2020 CLINICAL DATA:  Syncope EXAM: PORTABLE CHEST 1 VIEW COMPARISON:  July 02, 2020 FINDINGS: The lungs are clear. The heart size and pulmonary vascularity are normal. No adenopathy. No pneumothorax. No bone lesions. IMPRESSION: Lungs clear.  Cardiac silhouette within normal limits. Electronically Signed   By: Bretta Bang III M.D.   On: 07/25/2020 13:07    EKG: Independently reviewed.  Sinus rhythm normal axis no T wave versions intervals are preserved.  Assessment/Plan Acute GI bleeding/symptomatic anemia/microcytic anemia:: Concerned about an variceal bleed, as he does have a history of cirrhosis and he refused an EGD last time.  Place 2 large-bore diabetes type and screen for units of packed red blood cells. We will go ahead and place him on octreotide transfuse him 3 units of packed red blood cells check a CBC posttransfusion on. GI has been consulted he is currently n.p.o. for possible endoscopy. Macrocytosis likely due to alcohol, placed on folate and thiamine.  Leukocytosis With a history of ascites, and a recent GI bleed question SBP, his abdominal exam is benign he is afebrile, but he does have a white count of 30,000, will start him on IV Rocephin. Either way he is going to have to be on prophylactic antibiotics. Cultures have been ordered.  Ascites of liver Hold diuretic therapy at this point as he is borderline hypotensive.  Hepatic cirrhosis/Alcoholic cirrhosis of liver with  ascites (HCC): Continue lactulose.  Protein-calorie malnutrition, severe Ensure 3 times daily.    DVT Prophylaxis: Scd's Code Status: full  Family Communication: none  Disposition Plan: inpatient     It is my clinical opinion that admission to INPATIENT is reasonable and necessary in this 34 y.o. male alcoholic cirrhosis comes in with a GI bleed probably variceal, started on octreotide and IV Rocephin.  Needs inpatient endoscopy.  Given the aforementioned, the predictability of an adverse outcome is felt to be significant. I expect that the patient will require at least 2 midnights in the hospital to treat this condition.  Marinda Elk MD [facility]   07/25/2020, 2:27 PM

## 2020-07-25 NOTE — ED Notes (Signed)
Assumed care of patient at this time, nad noted, sr up x2, bed locked and low, call bell w/I reach.  Will continue to monitor. ° °

## 2020-07-25 NOTE — ED Provider Notes (Addendum)
[facility]-EMERGENCY DEPT Provider Note   CSN: [id number] Arrival date & time: 07/25/20  1134     History Chief Complaint  Patient presents with   Emesis   Weakness   Jaundice   Failure To Thrive    Matthew [name] is a 34 y.o. male.  Pt is a 34y/o male with hx of heavy alcohol abuse, alcoholic cirrhosis with portal hypertension, ascites who last drank in may and has had several hospitalizations due to cirrhotic complications who presents today with a complaint of feeling generally weak, fatigued and passing out every time he stands up.  Symptoms started around Friday.  He said prior to that he had started new medications 2 weeks ago and had lost 8 pounds within the extreme improvement in his ascites.  However on Friday he started noticing dark stools and did have one episode of vomiting that was black.  Since that time every time he stands up and even tries to take 1 step he has near syncope and falls to the floor.  He continues to take his medications and last bowel movement was yesterday.  He denies any chest pain, abdominal pain.  He does have shortness of breath with any activity but not at rest.  He denies using NSAIDs but does not take PPIs.  During patient's last hospitalization they recommended EGD for varices surveillance but patient refused at that time.  The history is provided by the patient.  Emesis Weakness Associated symptoms: vomiting        No past medical history on file.  Patient Active Problem List   Diagnosis Date Noted   Protein-calorie malnutrition, severe 07/04/2020   Hepatic encephalopathy (HCC) 07/02/2020   Alcohol use disorder, severe, in early remission (HCC) 07/02/2020   Tobacco abuse 07/02/2020   Ascites of liver 06/12/2020   Hyperbilirubinemia 06/12/2020   Elevated AST (SGOT) 06/12/2020   Hepatic cirrhosis (HCC) 06/12/2020   Leukocytosis 06/12/2020   Macrocytic anemia 06/12/2020   Hypoalbuminemia  06/12/2020    No past surgical history on file.     Family History  Problem Relation Age of Onset   Colon cancer Mother     Social History   Tobacco Use   Smoking status: Current Every Day Smoker    Packs/day: 0.25    Types: Cigarettes   Smokeless tobacco: Never Used  Substance Use Topics   Alcohol use: Yes   Drug use: Never    Home Medications Prior to Admission medications   Medication Sig Start Date End Date Taking? Authorizing Provider  folic acid (FOLVITE) 1 MG tablet Take 1 tablet (1 mg total) by mouth daily. 07/06/20  Yes Tyrone Nine, MD  furosemide (LASIX) 40 MG tablet Take 1 tablet (40 mg total) by mouth daily. 07/06/20  Yes Tyrone Nine, MD  lactulose (CHRONULAC) 10 GM/15ML solution Take 30 mLs (20 g total) by mouth 3 (three) times daily. 07/05/20 08/04/20 Yes Tyrone Nine, MD  Multiple Vitamins-Minerals (ADULT ONE DAILY GUMMIES PO) Take 1 tablet by mouth every evening. CBD   Yes [provider]  spironolactone (ALDACTONE) 100 MG tablet Take 1 tablet (100 mg total) by mouth daily. 07/06/20  Yes Tyrone Nine, MD  thiamine 100 MG tablet Take 1 tablet (100 mg total) by mouth daily. 07/06/20  Yes Tyrone Nine, MD    Allergies    Patient has no known allergies.  Review of Systems   Review of Systems  Gastrointestinal: Positive for vomiting.  Neurological: Positive  for weakness.  All other systems reviewed and are negative.   Physical Exam Updated Vital Signs BP (!) 103/54    Pulse (!) 110    Temp 97.8 F (36.6 C) (Oral)    Resp 12    SpO2 100%   Physical Exam Vitals and nursing note reviewed.  Constitutional:      General: He is not in acute distress.    Appearance: He is well-developed and underweight.  HENT:     Head: Normocephalic and atraumatic.     Mouth/Throat:     Mouth: Mucous membranes are dry.  Eyes:     General: Scleral icterus present.     Pupils: Pupils are equal, round, and reactive to light.     Comments: Pale conjunctiva   Cardiovascular:     Rate and Rhythm: Regular rhythm. Tachycardia present.     Heart sounds: Murmur heard.      Comments: Systolic flow murmur Pulmonary:     Effort: Pulmonary effort is normal. No respiratory distress.     Breath sounds: Normal breath sounds. No wheezing or rales.  Abdominal:     General: There is no distension.     Palpations: Abdomen is soft.     Tenderness: There is no abdominal tenderness. There is no guarding or rebound.  Genitourinary:    Rectum: Guaiac result positive.     Comments: Stools black Musculoskeletal:        General: No tenderness. Normal range of motion.     Cervical back: Normal range of motion and neck supple.     Right lower leg: No edema.     Left lower leg: No edema.  Skin:    General: Skin is warm and dry.     Coloration: Skin is pale.     Findings: No erythema or rash.     Comments: Patient is pale and jaundiced.  Various stages of ecchymosis over the upper and lower extremities  Neurological:     General: No focal deficit present.     Mental Status: He is alert and oriented to person, place, and time. Mental status is at baseline.     Comments: No asterixis  Psychiatric:        Mood and Affect: Mood normal.        Behavior: Behavior normal.        Thought Content: Thought content normal.      ED Results / Procedures / Treatments   Labs (all labs ordered are listed, but only abnormal results are displayed) Labs Reviewed  CBC WITH DIFFERENTIAL/PLATELET - Abnormal; Notable for the following components:      Result Value   WBC 30.7 (*)    RBC 1.09 (*)    Hemoglobin 4.0 (*)    HCT 11.3 (*)    MCV 103.7 (*)    MCH 36.7 (*)    RDW 15.8 (*)    Neutro Abs 23.1 (*)    Monocytes Absolute 2.8 (*)    Abs Immature Granulocytes 0.58 (*)    All other components within normal limits  COMPREHENSIVE METABOLIC PANEL - Abnormal; Notable for the following components:   Sodium 120 (*)    Chloride 84 (*)    Glucose, Bld 137 (*)    BUN 45  (*)    Calcium 8.3 (*)    Total Protein 5.0 (*)    Albumin 2.3 (*)    AST 83 (*)    Total Bilirubin 4.8 (*)    All other components  within normal limits  AMMONIA - Abnormal; Notable for the following components:   Ammonia 37 (*)    All other components within normal limits  POC OCCULT BLOOD, ED - Abnormal; Notable for the following components:   Fecal Occult Bld POSITIVE (*)    All other components within normal limits  SARS CORONAVIRUS 2 BY RT PCR (HOSPITAL ORDER, PERFORMED IN [facility])  CULTURE, BLOOD (ROUTINE X 2)  CULTURE, BLOOD (ROUTINE X 2)  LIPASE, BLOOD  TYPE AND SCREEN  PREPARE RBC (CROSSMATCH)    EKG EKG Interpretation  Date/Time:  Monday July 25 2020 11:46:38 EDT Ventricular Rate:  96 PR Interval:    QRS Duration: 102 QT Interval:  402 QTC Calculation: 508 R Axis:   24 Text Interpretation: Sinus rhythm Low voltage, precordial leads Abnormal R-wave progression, early transition Prolonged QT interval No significant change since last tracing Confirmed by Gwyneth SproutPlunkett, Carlton Buskey ([id number]) on 07/25/2020 12:18:42 PM Also confirmed by Gwyneth SproutPlunkett, Santonio Speakman ([id number]), editor Elita QuickWatlington, Beverly ([id number])  on 07/25/2020 12:31:52 PM   Radiology No results found.  Procedures Procedures (including critical care time)  Medications Ordered in ED Medications  0.9 %  sodium chloride infusion (Manually program via Guardrails IV Fluids) (has no administration in time range)  pantoprazole (PROTONIX) injection 40 mg (has no administration in time range)    ED Course  I have reviewed the triage vital signs and the nursing notes.  Pertinent labs & imaging results that were available during my care of the patient were reviewed by me and considered in my medical decision making (see chart for details).    MDM Rules/Calculators/A&P                          Patient is a 34 year old male with cirrhosis with complications who is presenting with repeated near syncope who has heme  positive stools and a new hemoglobin of 4 from 9 2 weeks ago.  Patient has no localized abdominal pain and no ascites at this time concerning for SBP.  Patient does have a leukocytosis of 30,000 which has an unclear pathology.  He has no cough, congestion or chest pain.  Oxygen saturation of 100% on room air.  Patient is mildly tachycardic here with a systolic flow murmur which is most likely related to his anemia.  Blood pressure remained stable.  Patient given IV Protonix.  2 units of blood ordered.  Will consult GI for further care.  Patient initially refused EGD but thought you were awake while you underwent it and he said he would not be able to tolerate that.  Discussed with the patient he would be sedated during this procedure and it may be very important to find out where the bleeding is coming from and he reports that he would consider it.  CMP today shows new hyponatremia of 120 with preserved creatinine and improving total bilirubin of 4.8.  AST is still elevated at 83 but not significantly changed.  Lipase within normal limits.  Patient will need admission for further care.  1:54 PM Pt with leukocytosis of 30,000 and with upper GI bleed patient was given Rocephin and blood cultures were drawn. MDM Number of Diagnoses or Management Options   Amount and/or Complexity of Data Reviewed Clinical lab tests: ordered and reviewed Tests in the medicine section of CPT: ordered and reviewed Decide to obtain previous medical records or to obtain history from someone other than the patient: yes Obtain history from someone  other than the patient: yes Review and summarize past medical records: yes Discuss the patient with other providers: yes Independent visualization of images, tracings, or specimens: yes  Risk of Complications, Morbidity, and/or Mortality Presenting problems: high Diagnostic procedures: low Management options: moderate  Patient Progress Patient progress: stable CRITICAL  CARE Performed by: Tasha Jindra Total critical care time: 30 minutes Critical care time was exclusive of separately billable procedures and treating other patients. Critical care was necessary to treat or prevent imminent or life-threatening deterioration. Critical care was time spent personally by me on the following activities: development of treatment plan with patient and/or surrogate as well as nursing, discussions with consultants, evaluation of patient's response to treatment, examination of patient, obtaining history from patient or surrogate, ordering and performing treatments and interventions, ordering and review of laboratory studies, ordering and review of radiographic studies, pulse oximetry and re-evaluation of patient's condition.    Final Clinical Impression(s) / ED Diagnoses Final diagnoses:  Symptomatic anemia  Acute upper GI bleed  Hyponatremia    Rx / DC Orders ED Discharge Orders    None       Gwyneth Sprout, MD 07/25/20 1312    Gwyneth Sprout, MD 07/25/20 1355

## 2020-07-25 NOTE — ED Triage Notes (Signed)
Patient BIB [facility] for weakness, failure to thieve, vomiting and jaundice.  Patient has a hx of ETOH abuse but says he has not drank since May.  Patient will not give for sure timeline of when he started vomiting.  EMS stated there was a trash can next to patient in the home with dark colored vomit.  Patient is too weak to walk.  EMS vitals were 100/46, 100-HR, 100% on room air, 98.0-temp, and 174 CBG.  20g left hand placed by EMS and patient received NS bolus by EMS.

## 2020-07-25 NOTE — H&P (View-Only) (Signed)
[facility] Consult  Referring Provider: [facility] Primary Care Physician:  Patient, No Pcp Per Primary Gastroenterologist: Unassigned  Reason for Consultation: Severe anemia, recently diagnosed alcohol-related cirrhosis with portal hypertension, coffee-ground emesis and melena  HPI: Matthew [name] is a 34 y.o. male recently signed out AMA on 07/05/2020 after being in the hospital for 3 days with history of heavy alcohol use, alcohol-related cirrhosis with portal hypertension, decompensation, paracentesis, no evidence of SBP.  Patient presented to the hospital with fatigue, exertional weakness, multiple episodes of presyncope on standing and 8 to 9 pound water weight loss after being on furosemide, spironolactone and lactulose on discharge from last admission.  Patient states that he has been noticing black tarry stools, intermittently seeing liquid black stools, intermittently has yellow stools. He has also been vomiting coffee-ground emesis on multiple occasions.  No prior endoscopy. History of significant alcohol use for over 10 years, 2-4 drinks of hard liquor on a daily basis for 10 years, last alcohol use reported on May 02, 2020.  His mother had colon cancer in her 60s, no prior colonoscopy.   History reviewed. No pertinent past medical history.  History reviewed. No pertinent surgical history.  Prior to Admission medications   Medication Sig Start Date End Date Taking? Authorizing Provider  folic acid (FOLVITE) 1 MG tablet Take 1 tablet (1 mg total) by mouth daily. 07/06/20  Yes Grunz, Ryan B, MD  furosemide (LASIX) 40 MG tablet Take 1 tablet (40 mg total) by mouth daily. 07/06/20  Yes Grunz, Ryan B, MD  lactulose (CHRONULAC) 10 GM/15ML solution Take 30 mLs (20 g total) by mouth 3 (three) times daily. 07/05/20 08/04/20 Yes Grunz, Ryan B, MD  Multiple Vitamins-Minerals (ADULT ONE DAILY GUMMIES PO) Take 1 tablet by mouth every evening. CBD   Yes [provider]  spironolactone (ALDACTONE) 100 MG tablet Take 1 tablet (100 mg total) by mouth daily. 07/06/20  Yes Grunz, Ryan B, MD  thiamine 100 MG tablet Take 1 tablet (100 mg total) by mouth daily. 07/06/20  Yes Grunz, Ryan B, MD    Current Facility-Administered Medications  Medication Dose Route Frequency Provider Last Rate Last Admin  . 0.9 %  sodium chloride infusion   Intravenous Continuous Feliz Ortiz, Abraham, MD      . folic acid 1 mg in sodium chloride 0.9 % 50 mL IVPB  1 mg Intravenous Once Feliz Ortiz, Abraham, MD      . octreotide (SANDOSTATIN) 500 mcg in sodium chloride 0.9 % 250 mL (2 mcg/mL) infusion  25 mcg/hr Intravenous Continuous Feliz Ortiz, Abraham, MD 12.5 mL/hr at 07/25/20 1544 25 mcg/hr at 07/25/20 1544  . thiamine (B-1) injection 100 mg  100 mg Intravenous Daily Feliz Ortiz, Abraham, MD       Current Outpatient Medications  Medication Sig Dispense Refill  . folic acid (FOLVITE) 1 MG tablet Take 1 tablet (1 mg total) by mouth daily. 30 tablet 0  . furosemide (LASIX) 40 MG tablet Take 1 tablet (40 mg total) by mouth daily. 30 tablet 0  . lactulose (CHRONULAC) 10 GM/15ML solution Take 30 mLs (20 g total) by mouth 3 (three) times daily. 2700 mL 0  . Multiple Vitamins-Minerals (ADULT ONE DAILY GUMMIES PO) Take 1 tablet by mouth every evening. CBD    . spironolactone (ALDACTONE) 100 MG tablet Take 1 tablet (100 mg total) by mouth daily. 30 tablet 0  . thiamine 100 MG tablet Take 1 tablet (100 mg total) by mouth daily. 30 tablet 0      Allergies as of 07/25/2020  . (No Known Allergies)    Family History  Problem Relation Age of Onset  . Colon cancer Mother     Social History   Socioeconomic History  . Marital status: Single    Spouse name: Not on file  . Number of children: Not on file  . Years of education: Not on file  . Highest education level: Not on file  Occupational History  . Occupation: not employed  Tobacco Use  . Smoking status: Current Every Day  Smoker    Packs/day: 1.00    Types: Cigarettes  . Smokeless tobacco: Never Used  Vaping Use  . Vaping Use: Never used  Substance and Sexual Activity  . Alcohol use: Yes    Alcohol/week: 5.0 standard drinks    Types: 5 Shots of liquor per week  . Drug use: Never  . Sexual activity: Not Currently  Other Topics Concern  . Not on file  Social History Narrative  . Not on file   Social Determinants of Health   Financial Resource Strain:   . Difficulty of Paying Living Expenses:   Food Insecurity:   . Worried About Running Out of Food in the Last Year:   . Ran Out of Food in the Last Year:   Transportation Needs:   . Lack of Transportation (Medical):   . Lack of Transportation (Non-Medical):   Physical Activity:   . Days of Exercise per Week:   . Minutes of Exercise per Session:   Stress:   . Feeling of Stress :   Social Connections:   . Frequency of Communication with Friends and Family:   . Frequency of Social Gatherings with Friends and Family:   . Attends Religious Services:   . Active Member of Clubs or Organizations:   . Attends Club or Organization Meetings:   . Marital Status:   Intimate Partner Violence:   . Fear of Current or Ex-Partner:   . Emotionally Abused:   . Physically Abused:   . Sexually Abused:     Review of Systems: Positive for: GI: Described in detail in HPI.    Gen: involuntary weight loss, denies any fever, chills, rigors, night sweats, anorexia, fatigue, weakness, malaise and sleep disorder CV: Denies chest pain, angina, palpitations, syncope, orthopnea, PND, peripheral edema, and claudication. Resp: Denies dyspnea, cough, sputum, wheezing, coughing up blood. GU : Denies urinary burning, blood in urine, urinary frequency, urinary hesitancy, nocturnal urination, and urinary incontinence. MS: Denies joint pain or swelling.  Denies muscle weakness, cramps, atrophy.  Derm: Denies rash, itching, oral ulcerations, hives, unhealing ulcers.  Psych:  Denies depression, anxiety, memory loss, suicidal ideation, hallucinations,  and confusion. Heme: Denies bruising and enlarged lymph nodes. Neuro:  Dizziness, denies any headaches,  paresthesias. Endo:  Denies any problems with DM, thyroid, adrenal function.  Physical Exam: Vital signs in last 24 hours: Temp:  [97.6 F (36.4 C)-97.9 F (36.6 C)] 97.6 F (36.4 C) (08/16 1355) Pulse Rate:  [98-120] 100 (08/16 1530) Resp:  [11-24] 18 (08/16 1530) BP: (98-122)/(46-63) 115/57 (08/16 1530) SpO2:  [98 %-100 %] 99 % (08/16 1530)    General:   Alert,  Well-developed, thinly built, pleasant and cooperative in NAD Head:  Normocephalic and atraumatic. Eyes:  Sclera clear, no icterus.   Prominent pallor Ears:  Normal auditory acuity. Nose:  No deformity, discharge,  or lesions. Mouth:  No deformity or lesions.  Oropharynx pink & moist. Neck:  Supple; no masses or thyromegaly. Lungs:    Clear throughout to auscultation.   No wheezes, crackles, or rhonchi. No acute distress. Heart:  Regular rate and rhythm; no murmurs, clicks, rubs,  or gallops. Extremities:  Without edema. Neurologic:  Alert and  oriented x4;  grossly normal neurologically. Skin:  Intact without significant lesions or rashes. Psych:  Alert and cooperative. Normal mood and affect. Abdomen:  Soft, nontender and nondistended. No masses, hepatosplenomegaly or hernias noted. Normal bowel sounds, without guarding, and without rebound.         Lab Results: Recent Labs    07/25/20 1221  WBC 30.7*  HGB 4.0*  HCT 11.3*  PLT 169   BMET Recent Labs    07/25/20 1221  NA 120*  K 4.5  CL 84*  CO2 24  GLUCOSE 137*  BUN 45*  CREATININE 0.92  CALCIUM 8.3*   LFT Recent Labs    07/25/20 1221  PROT 5.0*  ALBUMIN 2.3*  AST 83*  ALT 41  ALKPHOS 58  BILITOT 4.8*   PT/INR No results for input(s): LABPROT, INR in the last 72 hours.  Studies/Results: DG Chest Port 1 View  Result Date: 07/25/2020 CLINICAL DATA:  Syncope  EXAM: PORTABLE CHEST 1 VIEW COMPARISON:  July 02, 2020 FINDINGS: The lungs are clear. The heart size and pulmonary vascularity are normal. No adenopathy. No pneumothorax. No bone lesions. IMPRESSION: Lungs clear.  Cardiac silhouette within normal limits. Electronically Signed   By: William  Woodruff III M.D.   On: 07/25/2020 13:07    Impression: Severe symptomatic anemia, hemoglobin 4 on presentation(was 9 on 07/05/2020), intermittent coffee-ground emesis and melena Recently diagnosed alcohol-related cirrhosis, decompensated, ascites status post paracentesis-no edema or ascites noted, was discharged on furosemide 40 mg and spironolactone 100 mg a day. Was on lactulose, no evidence of encephalopathy noted  Leukocytosis, WBC 30.7 Hyponatremia, sodium 120  Plan: First unit PRBC being transfused, plan to transfuse 2 unit PRBC. Monitor H&H and keep hemoglobin above 7. Agree with IV octreotide drip Patient will be started on Protonix 40 mg IV every 12 hours. He was given ceftriaxone 1 g IV x1, will likely need for that to be continued while he is in the hospital. Continue folic acid and thiamine, will add multivitamin. Keep on clear liquid diet and keep n.p.o. post midnight for EGD with possible banding for esophageal varices in a.m. tomorrow. PT/INR not available on this admission, was 29.3 and 2.9 on 06/2620, will order for PT/INR for MELDNa calculation    LOS: 0 days   Braxson Hollingsworth, MD  07/25/2020, 4:40 PM  

## 2020-07-26 ENCOUNTER — Inpatient Hospital Stay (HOSPITAL_COMMUNITY): Payer: Self-pay | Admitting: Anesthesiology

## 2020-07-26 ENCOUNTER — Encounter (HOSPITAL_COMMUNITY)
Admission: EM | Disposition: A | Discharge status: Home / Self Care | Payer: Self-pay | Source: Home / Self Care | Attending: Internal Medicine

## 2020-07-26 ENCOUNTER — Encounter (HOSPITAL_COMMUNITY): Payer: Self-pay | Admitting: Internal Medicine

## 2020-07-26 DIAGNOSIS — I8510 Secondary esophageal varices without bleeding: Secondary | ICD-10-CM

## 2020-07-26 HISTORY — PX: ESOPHAGOGASTRODUODENOSCOPY (EGD) WITH PROPOFOL: SHX5813

## 2020-07-26 HISTORY — PX: ESOPHAGEAL BANDING: SHX5518

## 2020-07-26 LAB — BASIC METABOLIC PANEL
Anion gap: 10 (ref 5–15)
BUN: 37 mg/dL — ABNORMAL HIGH (ref 6–20)
CO2: 24 mmol/L (ref 22–32)
Calcium: 7.9 mg/dL — ABNORMAL LOW (ref 8.9–10.3)
Chloride: 86 mmol/L — ABNORMAL LOW (ref 98–111)
Creatinine, Ser: 0.99 mg/dL (ref 0.61–1.24)
GFR calc Af Amer: >60 mL/min (ref >60)
GFR calc non Af Amer: >60 mL/min (ref >60)
Glucose, Bld: 110 mg/dL — ABNORMAL HIGH (ref 70–99)
Potassium: 5.4 mmol/L — ABNORMAL HIGH (ref 3.5–5.1)
Sodium: 120 mmol/L — ABNORMAL LOW (ref 135–145)

## 2020-07-26 LAB — CBC
HCT: 18.5 % — ABNORMAL LOW (ref 39.0–52.0)
Hemoglobin: 6.6 g/dL — CL (ref 13.0–17.0)
MCH: 34 pg (ref 26.0–34.0)
MCHC: 35.7 g/dL (ref 30.0–36.0)
MCV: 95.4 fL (ref 80.0–100.0)
Platelets: 80 10*3/uL — ABNORMAL LOW (ref 150–400)
RBC: 1.94 MIL/uL — ABNORMAL LOW (ref 4.22–5.81)
RDW: 15 % (ref 11.5–15.5)
WBC: 17.7 10*3/uL — ABNORMAL HIGH (ref 4.0–10.5)
nRBC: 0 % (ref 0.0–0.2)

## 2020-07-26 LAB — HEMOGLOBIN AND HEMATOCRIT, BLOOD
HCT: 24.3 % — ABNORMAL LOW (ref 39.0–52.0)
HCT: 24.4 % — ABNORMAL LOW (ref 39.0–52.0)
Hemoglobin: 8.9 g/dL — ABNORMAL LOW (ref 13.0–17.0)
Hemoglobin: 8.6 g/dL — ABNORMAL LOW (ref 13.0–17.0)

## 2020-07-26 LAB — PREPARE RBC (CROSSMATCH)

## 2020-07-26 SURGERY — ESOPHAGOGASTRODUODENOSCOPY (EGD) WITH PROPOFOL
Anesthesia: Monitor Anesthesia Care

## 2020-07-26 MED ORDER — SODIUM CHLORIDE 0.9% IV SOLUTION: Freq: Once | INTRAVENOUS | Status: AC

## 2020-07-26 MED ORDER — PROPOFOL 500 MG/50ML IV EMUL
INTRAVENOUS | Status: AC
  Filled 2020-07-26: qty 50

## 2020-07-26 MED ORDER — PROPOFOL 10 MG/ML IV BOLUS
INTRAVENOUS | Status: AC
  Filled 2020-07-26: qty 40

## 2020-07-26 MED ORDER — PROPOFOL 500 MG/50ML IV EMUL
INTRAVENOUS | Status: DC | PRN
  Administered 2020-07-26: 125 ug/kg/min via INTRAVENOUS

## 2020-07-26 MED ORDER — PROPOFOL 10 MG/ML IV BOLUS
INTRAVENOUS | Status: DC | PRN
  Administered 2020-07-26: 10 mg via INTRAVENOUS
  Administered 2020-07-26 (×3): 20 mg via INTRAVENOUS

## 2020-07-26 MED ORDER — NICOTINE 21 MG/24HR TD PT24
21.0000 mg | MEDICATED_PATCH | Freq: Every day | TRANSDERMAL | Status: DC
  Administered 2020-07-26 – 2020-07-28 (×3): 21 mg via TRANSDERMAL
  Filled 2020-07-26 (×3): qty 1

## 2020-07-26 SURGICAL SUPPLY — 15 items

## 2020-07-26 NOTE — Hospital Course (Addendum)
Matthew [name] is a 34 y.o. male with PMH alcoholic cirrhosis with ascites who presented to the hospital with weight loss and generalized weakness. He had recently undergone a paracentesis x2 in July and states that he has been on spironolactone and Lasix at home.  He also states that he still has not had anything to drink since the end of May. He underwent work-up in the ER and was found to have a hemoglobin of 4 g/dL.  He underwent total of 3 units of blood transfusions and repeat Hgb had improved to 8.9 g/dL.  GI was consulted and he was taken for urgent EGD.  He was found to have grade III esophageal varices which were treated with 6 bands in total.  He was also found to have hypertensive gastropathy and hypertensive duodenopathy.  He is recommended to have a repeat EGD in 6 weeks. He was also started on Rocephin and octreotide on admission. He completed 72 hour octreotide infusion. At discharge he was continued on lasix, spironolactone, started on nadolol for ease in dosing (can be uptitrated at follow up if tolerating), lactulose, and protonix.  His Hgb remained stable as well as vitals and he was considered stable for discharge with ongoing outpatient management.

## 2020-07-26 NOTE — Assessment & Plan Note (Addendum)
-   3 esophageal varices found on EGD on 8/17; also hypertensive gastro/duodenopathy - trending H/H, see anemia - continue Rocephin in setting of alcoholic cirrhosis - continue Octreotide x 72 hours; tentative d/c home on 8/19 in the afternoon - repeat EGD in 6 weeks per GI

## 2020-07-26 NOTE — Brief Op Note (Signed)
07/25/2020 - 07/26/2020  8:12 AM  PATIENT:  Matthew [name]  34 y.o. male  PRE-OPERATIVE DIAGNOSIS:  black stools, coffee ground emesis, anemia  POST-OPERATIVE DIAGNOSIS:  esopgageal banding,portal hypertensive gastropathy  PROCEDURE:  Procedure(s): ESOPHAGOGASTRODUODENOSCOPY (EGD) WITH PROPOFOL (N/A) ESOPHAGEAL BANDING  SURGEON:  Surgeon(s) and Role:    Ronnette Juniper, MD - Primary  PHYSICIAN ASSISTANT:   ASSISTANTS: Lilli Few, Ja,mie Billups, Tech  ANESTHESIA:   MAC  EBL:  Minimal  BLOOD ADMINISTERED:none  DRAINS: none   LOCAL MEDICATIONS USED:  NONE  SPECIMEN:  No Specimen  DISPOSITION OF SPECIMEN:  N/A  COUNTS:  YES  TOURNIQUET:  * No tourniquets in log *  DICTATION: .Dragon Dictation  PLAN OF CARE: Admit to inpatient   PATIENT DISPOSITION:  PACU - hemodynamically stable.   Delay start of Pharmacological VTE agent (>24hrs) due to surgical blood loss or risk of bleeding: not applicable

## 2020-07-26 NOTE — Assessment & Plan Note (Addendum)
-   continue diet - patient encouraged for adequate nutrition on d/c - continue vitamins at discharge

## 2020-07-26 NOTE — Assessment & Plan Note (Addendum)
-   no active bleed but felt to be culprit to patient's anemia - s/p 6 bands total to 3 varices found - will need BB at discharge; patient discharged on nadolol 20 mg daily.  If tolerating at outpatient follow-up, can be further increased - repeat EGD in 6 weeks

## 2020-07-26 NOTE — Anesthesia Postprocedure Evaluation (Signed)
Anesthesia Post Note  Patient: Matthew [name]  Procedure(s) Performed: ESOPHAGOGASTRODUODENOSCOPY (EGD) WITH PROPOFOL (N/A ) ESOPHAGEAL BANDING     Patient location during evaluation: PACU Anesthesia Type: MAC Level of consciousness: awake and alert Pain management: pain level controlled Vital Signs Assessment: post-procedure vital signs reviewed and stable Respiratory status: spontaneous breathing, nonlabored ventilation, respiratory function stable and patient connected to nasal cannula oxygen Cardiovascular status: stable and blood pressure returned to baseline Postop Assessment: no apparent nausea or vomiting Anesthetic complications: no   No complications documented.  Last Vitals:  Vitals:   07/26/20 0917 07/26/20 1050  BP: 116/74 118/71  Pulse: 74 74  Resp: 16 12  Temp: (!) 36.4 C 36.4 C  SpO2: 99% 98%    Last Pain:  Vitals:   07/26/20 1050  TempSrc: Oral  PainSc:                  Tiajuana Amass

## 2020-07-26 NOTE — ED Notes (Signed)
Pt returned from ENDO in pain. C/O throat pain following the procedure. Pt is A&O x4 NAD, Jaundice. Updated on plan of care.

## 2020-07-26 NOTE — ED Notes (Signed)
Patient is resting comfortably. 

## 2020-07-26 NOTE — Assessment & Plan Note (Addendum)
-  Considered due to slowly bleeding esophageal varices and underlying portal hypertension -s/p 3 units total PRBC for admission so far - trend H/H transfuse further if Hgb<7 g/dL -Patient was also evaluated by physical therapy prior to discharge and considered independent/stable enough for discharging home

## 2020-07-26 NOTE — Anesthesia Procedure Notes (Addendum)
Procedure Name: MAC Date/Time: 07/26/2020 7:47 AM Performed by: Eben Burow, CRNA Pre-anesthesia Checklist: Patient identified, Emergency Drugs available, Suction available, Patient being monitored and Timeout performed Oxygen Delivery Method: Simple face mask Placement Confirmation: positive ETCO2

## 2020-07-26 NOTE — Op Note (Signed)
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   Findings: Large grade 3 esophageal varices noted, 6 bands placed for complete eradication. Coffee-ground dark liquid found in gastric cavity, on lavage and suctioning diffuse oozing noted due to severe portal hypertensive gastropathy. No evidence of gastric varices. Friable mucosa noted in duodenal bulb, first and second portion of duodenum related to portal hypertensive duodenopathy.   Recommendations: Clear liquid diet for 6 hours, full liquid diet thereafter. Continue octreotide drip for another 48 hours. Plan to switch to nonselective beta-blocker as an outpatient. Monitor H&H with plans to keep hemoglobin between 7-8, transfuse if needed. Patient will need repeat endoscopy for retreatment of varices in 6 weeks as an outpatient. We will continue to follow.  Kerin Salen, MD.

## 2020-07-26 NOTE — Op Note (Signed)
[facility] Patient Name: Matthew [name] Procedure Date: 07/26/2020 MRN: [id number] Attending MD: Kerin SalenArya Vondell Babers , MD Date of Birth: 1986/01/15 CSN: [id number] Age: 34 Admit Type: Emergency Department Procedure:                Upper GI endoscopy Indications:              Acute post hemorrhagic anemia, Coffee-ground                            emesis, Melena, Cirrhosis with suspected esophageal                            varices Providers:                Kerin SalenArya Orvile Corona, MD, Dwain SarnaPatricia Ford, RN, Beryle BeamsJanie Billups,                            Technician Referring MD:             [facility] Medicines:                Propofol per Anesthesia Complications:            No immediate complications. Estimated blood loss:                            Minimal. Estimated Blood Loss:     Estimated blood loss was minimal. Procedure:                Pre-Anesthesia Assessment:                           - Prior to the procedure, a History and Physical                            was performed, and patient medications and                            allergies were reviewed. The patient's tolerance of                            previous anesthesia was also reviewed. The risks                            and benefits of the procedure and the sedation                            options and risks were discussed with the patient.                            All questions were answered, and informed consent                            was obtained. Prior Anticoagulants: The patient has                            taken no previous anticoagulant or antiplatelet  agents. ASA Grade Assessment: IV - A patient with                            severe systemic disease that is a constant threat                            to life. After reviewing the risks and benefits,                            the patient was deemed in satisfactory condition to                            undergo the  procedure.                           After obtaining informed consent, the endoscope was                            passed under direct vision. Throughout the                            procedure, the patient's blood pressure, pulse, and                            oxygen saturations were monitored continuously. The                            GIF-H190 ([id number] Olympus gastroscope was                            introduced through the mouth, and advanced to the                            second part of duodenum. The upper GI endoscopy was                            accomplished without difficulty. The patient                            tolerated the procedure well. Scope In: Scope Out: Findings:      Grade III varices were found in the middle third of the esophagus and in       the lower third of the esophagus. They were large in size. Six bands       were successfully placed with complete eradication, resulting in       deflation of varices. There was no bleeding during and at the end of the       procedure.      Dark black liquid, coffee ground liquid was found in the gastric body.      Severe portal hypertensive gastropathy was found in the entire examined       stomach. Self limited oozing was noted from different areas of the       stomach after lavage.      Patchy severely friable mucosa with self limited active bleeding/oozing  on lavage was found in the duodenal bulb, in the first portion of the       duodenum and in the second portion of the duodenum.      The cardia and gastric fundus were normal on retroflexion. There was no       evidence of gastric varices. Impression:               - Grade III esophageal varices. Completely                            eradicated. Banded.                           - Dark/coffee ground liquid in the gastric body.                           - Severe diffuse portal hypertensive gastropathy.                           - Bleeding friable  duodenal mucosa- portal                            hypertensive duodenopathy.                           - No specimens collected. Moderate Sedation:      Patient did not receive moderate sedation for this procedure, but       instead received monitored anesthesia care. Recommendation:           - Clear liquid diet for 6 hours, thereafter full                            liquid diet.                           - Continue present medications- IV octreotide drip                            for another 48 hours and switch to non selective                            beta blocker on discharge, continue PPI BID                            indefinitely.                           - Repeat upper endoscopy in 6 weeks for retreatment. Procedure Code(s):        --- Professional ---                           [phone], Esophagogastroduodenoscopy, flexible,                            transoral; with band ligation of esophageal/gastric  varices Diagnosis Code(s):        --- Professional ---                           K74.60, Unspecified cirrhosis of liver                           I85.10, Secondary esophageal varices without                            bleeding                           K92.2, Gastrointestinal hemorrhage, unspecified                           K76.6, Portal hypertension                           K31.89, Other diseases of stomach and duodenum                           D62, Acute posthemorrhagic anemia                           K92.0, Hematemesis                           K92.1, Melena (includes Hematochezia) CPT copyright 2019 American Medical Association. All rights reserved. The codes documented in this report are preliminary and upon coder review may  be revised to meet current compliance requirements. Kerin Salen, MD 07/26/2020 8:13:11 AM This report has been signed electronically. Number of Addenda: 0

## 2020-07-26 NOTE — Transfer of Care (Signed)
Immediate Anesthesia Transfer of Care Note  Patient: Matthew [name]  Procedure(s) Performed: ESOPHAGOGASTRODUODENOSCOPY (EGD) WITH PROPOFOL (N/A ) ESOPHAGEAL BANDING  Patient Location: PACU and Endoscopy Unit  Anesthesia Type:MAC  Level of Consciousness: awake, alert  and patient cooperative  Airway & Oxygen Therapy: Patient Spontanous Breathing and Patient connected to face mask oxygen  Post-op Assessment: Report given to RN and Post -op Vital signs reviewed and stable  Post vital signs: Reviewed and stable  Last Vitals:  Vitals Value Taken Time  BP    Temp    Pulse 83 07/26/20 0812  Resp 13 07/26/20 0812  SpO2 99 % 07/26/20 0812  Vitals shown include unvalidated device data.  Last Pain:  Vitals:   07/26/20 0715  TempSrc: Oral  PainSc: 0-No pain         Complications: No complications documented.

## 2020-07-26 NOTE — Progress Notes (Signed)
PROGRESS NOTE    Matthew [name]   DEY:[id number]  DOB: March 02, 1986  DOA: 07/25/2020     1  PCP: Patient, No Pcp Per  CC: weakness  Hospital Course: Matthew [name] is a 34 y.o. male with PMH alcoholic cirrhosis with ascites who presented to the hospital with weight loss and generalized weakness. He had recently undergone a paracentesis x2 in July and states that he has been on spironolactone and Lasix at home.  He also states that he still has not had anything to drink since the end of May. He underwent work-up in the ER and was found to have a hemoglobin of 4 g/dL.  He underwent total of 3 units of blood transfusions and repeat Hgb had improved to 8.9 g/dL.  GI was consulted and he was taken for urgent EGD.  He was found to have grade III esophageal varices which were treated with 6 bands in total.  He was also found to have hypertensive gastropathy and hypertensive duodenopathy.  He is recommended to have a repeat EGD in 6 weeks. He was also started on Rocephin and octreotide on admission.   Interval History:  Seen in room after EGD. Feeling okay, still tired and jaundiced. Understands he will remain in the hospital for another 2-3 days.   Old records reviewed in assessment of this patient  ROS: Constitutional: negative for chills and fevers, Respiratory: negative for cough, Cardiovascular: negative for chest pain and Gastrointestinal: negative for abdominal pain  Assessment & Plan: Symptomatic anemia -Considered due to slowly bleeding esophageal varices and underlying portal hypertension -s/p 3 units total PRBC for admission so far - trend H/H transfuse further if Hgb<7 g/dL  Protein-calorie malnutrition, severe - will consult RD in am  GI bleed - 3 esophageal varices found on EGD on 8/17; also hypertensive gastro/duodenopathy - trending H/H, see anemia - continue Rocephin in setting of alcoholic cirrhosis - continue Octreotide x 72 hours - repeat EGD in 6 weeks  per GI  Alcoholic cirrhosis of liver with ascites (HCC) - patient claims last drink end of May 2021; still grossly jaundiced on exam but TB less than previous, still elevated - resume spironolactone/lasix when able - continue lactulose when able  Secondary esophageal varices without bleeding (HCC) - no active bleed but felt to be culprit to patient's anemia - s/p 6 bands total to 3 varices found - will need BB (likely nadolol at d/c) - repeat EGD in 6 weeks   Antimicrobials: Rocephin  DVT prophylaxis: SCD Code Status: Full Family Communication: none present Disposition Plan:  Status is: Inpatient  Remains inpatient appropriate because:Unsafe d/c plan, IV treatments appropriate due to intensity of illness or inability to take PO and Inpatient level of care appropriate due to severity of illness   Dispo: The patient is from: Home              Anticipated d/c is to: Home              Anticipated d/c date is: 2 days              Patient currently is not medically stable to d/c.       Objective: Blood pressure 118/81, pulse 93, temperature 97.9 F (36.6 C), temperature source Oral, resp. rate 16, height 5\' 7"  (1.702 m), weight 54.4 kg, SpO2 99 %.  Examination: General appearance: alert, cooperative, fatigued, no distress and grossly jaundiced Head: Normocephalic, without obvious abnormality, atraumatic Eyes: scleral icterus Lungs: clear to auscultation bilaterally Heart:  regular rate and rhythm and S1, S2 normal Abdomen: normal findings: bowel sounds normal and soft, non-tender Extremities: no edema Skin: grossly jaundiced throughout Neurologic: Grossly normal  Consultants:   GI  Procedures:   07/26/20 Esophageal banding   Data Reviewed: I have personally reviewed following labs and imaging studies Results for orders placed or performed during the hospital encounter of 07/25/20 (from the past 24 hour(s))  Hemoglobin and hematocrit, blood     Status: Abnormal    Collection Time: 07/25/20  9:13 PM  Result Value Ref Range   Hemoglobin 6.6 (LL) 13.0 - 17.0 g/dL   HCT 32.418.5 (L) 39 - 52 %  CBC     Status: Abnormal   Collection Time: 07/26/20  4:16 AM  Result Value Ref Range   WBC 17.7 (H) 4.0 - 10.5 K/uL   RBC 1.94 (L) 4.22 - 5.81 MIL/uL   Hemoglobin 6.6 (LL) 13.0 - 17.0 g/dL   HCT 40.118.5 (L) 39 - 52 %   MCV 95.4 80.0 - 100.0 fL   MCH 34.0 26.0 - 34.0 pg   MCHC 35.7 30.0 - 36.0 g/dL   RDW 02.715.0 25.311.5 - 66.415.5 %   Platelets 80 (L) 150 - 400 K/uL   nRBC 0.0 0.0 - 0.2 %  Basic metabolic panel     Status: Abnormal   Collection Time: 07/26/20  4:16 AM  Result Value Ref Range   Sodium 120 (L) 135 - 145 mmol/L   Potassium 5.4 (H) 3.5 - 5.1 mmol/L   Chloride 86 (L) 98 - 111 mmol/L   CO2 24 22 - 32 mmol/L   Glucose, Bld 110 (H) 70 - 99 mg/dL   BUN 37 (H) 6 - 20 mg/dL   Creatinine, Ser 4.030.99 0.61 - 1.24 mg/dL   Calcium 7.9 (L) 8.9 - 10.3 mg/dL   GFR calc non Af Amer >60 >60 mL/min   GFR calc Af Amer >60 >60 mL/min   Anion gap 10 5 - 15  Prepare RBC (crossmatch)     Status: None   Collection Time: 07/26/20  6:54 AM  Result Value Ref Range   Order Confirmation      ORDER PROCESSED BY BLOOD BANK Performed at [facility], [address] [place], [place] [address]   BLOOD TRANSFUSION REPORT - SCANNED     Status: None   Collection Time: 07/26/20  9:52 AM   Narrative   Ordered by an unspecified provider.  Hemoglobin and hematocrit, blood     Status: Abnormal   Collection Time: 07/26/20  1:45 PM  Result Value Ref Range   Hemoglobin 8.9 (L) 13.0 - 17.0 g/dL   HCT 95.624.3 (L) 39 - 52 %    Recent Results (from the past 240 hour(s))  SARS Coronavirus 2 by RT PCR (hospital order, performed in [facility] hospital lab) Nasopharyngeal Nasopharyngeal Swab     Status: None   Collection Time: 07/25/20  1:04 PM   Specimen: Nasopharyngeal Swab  Result Value Ref Range Status   SARS Coronavirus 2 NEGATIVE NEGATIVE Final    Comment:  (NOTE) SARS-CoV-2 target nucleic acids are NOT DETECTED.  The SARS-CoV-2 RNA is generally detectable in upper and lower respiratory specimens during the acute phase of infection. The lowest concentration of SARS-CoV-2 viral copies this assay can detect is 250 copies / mL. A negative result does not preclude SARS-CoV-2 infection and should not be used as the sole basis for treatment or other patient management decisions.  A negative result  may occur with improper specimen collection / handling, submission of specimen other than nasopharyngeal swab, presence of viral mutation(s) within the areas targeted by this assay, and inadequate number of viral copies (<250 copies / mL). A negative result must be combined with clinical observations, patient history, and epidemiological information.  Fact Sheet for Patients:   BoilerBrush.com.cy  Fact Sheet for Healthcare Providers: https://pope.com/  This test is not yet approved or  cleared by the [place] FDA and has been authorized for detection and/or diagnosis of SARS-CoV-2 by FDA under an Emergency Use Authorization (EUA).  This EUA will remain in effect (meaning this test can be used) for the duration of the COVID-19 declaration under Section 564(b)(1) of the Act, 21 U.S.C. section 360bbb-3(b)(1), unless the authorization is terminated or revoked sooner.  Performed at [facility], [address], [place], [place] [address]   Blood culture (routine x 2)     Status: None (Preliminary result)   Collection Time: 07/25/20  1:42 PM   Specimen: BLOOD LEFT HAND  Result Value Ref Range Status   Specimen Description   Final    BLOOD LEFT HAND Performed at [facility], [address], [place], [place] [address]    Special Requests   Final    BOTTLES DRAWN AEROBIC AND ANAEROBIC Blood Culture adequate volume Performed at [facility],  [address] [place], [place] [address]    Culture   Final    NO GROWTH < 24 HOURS Performed at [facility], [address] [place], [place] [address]    Report Status PENDING  Incomplete  Blood culture (routine x 2)     Status: None (Preliminary result)   Collection Time: 07/25/20  1:47 PM   Specimen: BLOOD  Result Value Ref Range Status   Specimen Description   Final    BLOOD RIGHT ANTECUBITAL Performed at [facility], [address], [place], [place] [address]    Special Requests   Final    BOTTLES DRAWN AEROBIC AND ANAEROBIC Blood Culture results may not be optimal due to an excessive volume of blood received in culture bottles Performed at [facility], [address] [place], [place] [address]    Culture   Final    NO GROWTH < 24 HOURS Performed at [facility], [address] [place], [place] [address]    Report Status PENDING  Incomplete     Radiology Studies: DG Chest Port 1 View  Result Date: 07/25/2020 CLINICAL DATA:  Syncope EXAM: PORTABLE CHEST 1 VIEW COMPARISON:  July 02, 2020 FINDINGS: The lungs are clear. The heart size and pulmonary vascularity are normal. No adenopathy. No pneumothorax. No bone lesions. IMPRESSION: Lungs clear.  Cardiac silhouette within normal limits. Electronically Signed   By: Bretta Bang III M.D.   On: 07/25/2020 13:07   DG Chest Port 1 View  Final Result       Scheduled Meds: . folic acid  1 mg Oral Daily  . multivitamin  15 mL Oral Daily  . nicotine  21 mg Transdermal Daily  . pantoprazole (PROTONIX) IV  40 mg Intravenous Q12H  . sodium chloride flush  3 mL Intravenous Q12H  . thiamine injection  100 mg Intravenous Daily   PRN Meds: sodium chloride, morphine injection, ondansetron **OR** ondansetron (ZOFRAN) IV, sodium chloride flush Continuous Infusions: . sodium chloride    . cefTRIAXone (ROCEPHIN)  IV Stopped (07/25/20 2135)  .  folic acid (FOLVITE) IVPB    .  octreotide  (SANDOSTATIN)    IV infusion 25 mcg/hr (07/25/20 1544)      LOS: 1 day  Time spent: Greater than 50% of the 35 minute visit was spent in counseling/coordination of care for the patient as laid out in the A&P.   Lewie Chamber, MD [facility] 07/26/2020, 8:05 PM   Contact via secure chat.  To contact the attending provider between 7A-7P or the covering provider during after hours 7P-7A, please log into the web site [email or web address] and access using universal [place] password for that web site. If you do not have the password, please call the hospital operator.

## 2020-07-26 NOTE — Assessment & Plan Note (Addendum)
-   patient claims last drink end of May 2021; still grossly jaundiced on exam but TB less than previous, still elevated - resume spironolactone/lasix when able - continue lactulose when able; patient requesting assistance with lactulose due to price; discussed with social work.  Prescriptions sent to [facility] pharmacy

## 2020-07-26 NOTE — Anesthesia Preprocedure Evaluation (Signed)
Anesthesia Evaluation  Patient identified by MRN, date of birth, ID band Patient awake    Reviewed: Allergy & Precautions, NPO status , Patient's Chart, lab work & pertinent test results  Airway Mallampati: II  TM Distance: >3 FB     Dental   Pulmonary Current Smoker,    breath sounds clear to auscultation       Cardiovascular negative cardio ROS   Rhythm:Regular Rate:Normal     Neuro/Psych negative neurological ROS     GI/Hepatic negative GI ROS, (+) Cirrhosis   ascites  substance abuse  alcohol use,   Endo/Other  negative endocrine ROS  Renal/GU negative Renal ROS     Musculoskeletal   Abdominal   Peds  Hematology  (+) anemia ,   Anesthesia Other Findings   Reproductive/Obstetrics                             Anesthesia Physical Anesthesia Plan  ASA: IV  Anesthesia Plan: MAC   Post-op Pain Management:    Induction:   PONV Risk Score and Plan: 0 and Propofol infusion, Ondansetron and Treatment may vary due to age or medical condition  Airway Management Planned: Natural Airway and Nasal Cannula  Additional Equipment:   Intra-op Plan:   Post-operative Plan:   Informed Consent: I have reviewed the patients History and Physical, chart, labs and discussed the procedure including the risks, benefits and alternatives for the proposed anesthesia with the patient or authorized representative who has indicated his/her understanding and acceptance.       Plan Discussed with:   Anesthesia Plan Comments:         Anesthesia Quick Evaluation

## 2020-07-26 NOTE — Interval H&P Note (Signed)
History and Physical Interval Note: 34/male with decompensated alcohol related cirrhosis with symptomatic anemia, coffee ground emesis and melena for an EGD with possible banding.  07/26/2020 7:38 AM  Matthew [name]  has presented today for EGD with banding, with the diagnosis of black stools, coffee ground emesis, anemia.  The various methods of treatment have been discussed with the patient and family. After consideration of risks, benefits and other options for treatment, the patient has consented to  Procedure(s): ESOPHAGOGASTRODUODENOSCOPY (EGD) WITH PROPOFOL (N/A) as a surgical intervention.  The patient's history has been reviewed, patient examined, no change in status, stable for surgery.  I have reviewed the patient's chart and labs.  Questions were answered to the patient's satisfaction.     Kerin Salen

## 2020-07-27 ENCOUNTER — Encounter (HOSPITAL_COMMUNITY): Payer: Self-pay | Admitting: Gastroenterology

## 2020-07-27 LAB — CBC WITH DIFFERENTIAL/PLATELET
Abs Immature Granulocytes: 0.18 10*3/uL — ABNORMAL HIGH (ref 0.00–0.07)
Basophils Absolute: 0.1 10*3/uL (ref 0.0–0.1)
Basophils Relative: 0 %
Eosinophils Absolute: 0.3 10*3/uL (ref 0.0–0.5)
Eosinophils Relative: 1 %
HCT: 25.1 % — ABNORMAL LOW (ref 39.0–52.0)
Hemoglobin: 8.9 g/dL — ABNORMAL LOW (ref 13.0–17.0)
Immature Granulocytes: 1 %
Lymphocytes Relative: 11 %
Lymphs Abs: 2.2 10*3/uL (ref 0.7–4.0)
MCH: 32.7 pg (ref 26.0–34.0)
MCHC: 35.5 g/dL (ref 30.0–36.0)
MCV: 92.3 fL (ref 80.0–100.0)
Monocytes Absolute: 1.9 10*3/uL — ABNORMAL HIGH (ref 0.1–1.0)
Monocytes Relative: 9 %
Neutro Abs: 15.8 10*3/uL — ABNORMAL HIGH (ref 1.7–7.7)
Neutrophils Relative %: 78 %
Platelets: 113 10*3/uL — ABNORMAL LOW (ref 150–400)
RBC: 2.72 MIL/uL — ABNORMAL LOW (ref 4.22–5.81)
RDW: 17.2 % — ABNORMAL HIGH (ref 11.5–15.5)
WBC: 20.3 10*3/uL — ABNORMAL HIGH (ref 4.0–10.5)
nRBC: 0 % (ref 0.0–0.2)

## 2020-07-27 LAB — TYPE AND SCREEN
ABO/RH(D): O POS
Antibody Screen: NEGATIVE
Unit division: 0
Unit division: 0
Unit division: 0

## 2020-07-27 LAB — COMPREHENSIVE METABOLIC PANEL
ALT: 46 U/L — ABNORMAL HIGH (ref 0–44)
AST: 75 U/L — ABNORMAL HIGH (ref 15–41)
Albumin: 2.3 g/dL — ABNORMAL LOW (ref 3.5–5.0)
Alkaline Phosphatase: 60 U/L (ref 38–126)
Anion gap: 7 (ref 5–15)
BUN: 24 mg/dL — ABNORMAL HIGH (ref 6–20)
CO2: 23 mmol/L (ref 22–32)
Calcium: 8 mg/dL — ABNORMAL LOW (ref 8.9–10.3)
Chloride: 92 mmol/L — ABNORMAL LOW (ref 98–111)
Creatinine, Ser: 0.72 mg/dL (ref 0.61–1.24)
GFR calc Af Amer: >60 mL/min (ref >60)
GFR calc non Af Amer: >60 mL/min (ref >60)
Glucose, Bld: 106 mg/dL — ABNORMAL HIGH (ref 70–99)
Potassium: 4.8 mmol/L (ref 3.5–5.1)
Sodium: 122 mmol/L — ABNORMAL LOW (ref 135–145)
Total Bilirubin: 6.8 mg/dL — ABNORMAL HIGH (ref 0.3–1.2)
Total Protein: 5.2 g/dL — ABNORMAL LOW (ref 6.5–8.1)

## 2020-07-27 LAB — BPAM RBC
Blood Product Expiration Date: 202109102359
Blood Product Expiration Date: 202109102359
Blood Product Expiration Date: 202109112359
ISSUE DATE / TIME: 202108161333
ISSUE DATE / TIME: 202108161707
ISSUE DATE / TIME: 202108170715
Unit Type and Rh: 5100
Unit Type and Rh: 5100
Unit Type and Rh: 5100

## 2020-07-27 LAB — MAGNESIUM: Magnesium: 2.2 mg/dL (ref 1.7–2.4)

## 2020-07-27 LAB — FOLATE: Folate: 13.9 ng/mL

## 2020-07-27 MED ORDER — THIAMINE HCL 100 MG PO TABS
100.0000 mg | ORAL_TABLET | Freq: Every day | ORAL | Status: DC
  Administered 2020-07-28: 100 mg via ORAL
  Filled 2020-07-27: qty 1

## 2020-07-27 NOTE — ED Notes (Signed)
Patient has been getting up to the bedside commode with no assistance.  Wanted to attempt to walk around but was not able to tolerate even with a walker.

## 2020-07-27 NOTE — Progress Notes (Signed)
PROGRESS NOTE    Matthew [name]   XTG:[id number]  DOB: 12/23/1985  DOA: 07/25/2020     2  PCP: Patient, No Pcp Per  CC: weakness  Hospital Course: Matthew [name] is a 34 y.o. male with PMH alcoholic cirrhosis with ascites who presented to the hospital with weight loss and generalized weakness. He had recently undergone a paracentesis x2 in July and states that he has been on spironolactone and Lasix at home.  He also states that he still has not had anything to drink since the end of May. He underwent work-up in the ER and was found to have a hemoglobin of 4 g/dL.  He underwent total of 3 units of blood transfusions and repeat Hgb had improved to 8.9 g/dL.  GI was consulted and he was taken for urgent EGD.  He was found to have grade III esophageal varices which were treated with 6 bands in total.  He was also found to have hypertensive gastropathy and hypertensive duodenopathy.  He is recommended to have a repeat EGD in 6 weeks. He was also started on Rocephin and octreotide on admission.   Interval History:  No events overnight.  Still awaiting her room in the ER but is comfortably resting in bed.  Reviewed his labs with him and also the further plan regarding discharge.  He understands he will be on a new medication, probably propranolol at discharge.  He was also asking if there was any way to get some financial assistance for his lactulose as he says it is expensive for him.  Otherwise he was comfortable and cooperative.  Old records reviewed in assessment of this patient  ROS: Constitutional: negative for chills and fevers, Respiratory: negative for cough, Cardiovascular: negative for chest pain and Gastrointestinal: negative for abdominal pain  Assessment & Plan: Symptomatic anemia -Considered due to slowly bleeding esophageal varices and underlying portal hypertension -s/p 3 units total PRBC for admission so far - trend H/H transfuse further if Hgb<7  g/dL  Protein-calorie malnutrition, severe - continue diet - patient encouraged for adequate nutrition on d/c - continue vitamins at discharge   GI bleed - 3 esophageal varices found on EGD on 8/17; also hypertensive gastro/duodenopathy - trending H/H, see anemia - continue Rocephin in setting of alcoholic cirrhosis - continue Octreotide x 72 hours; tentative d/c home on 8/19 in the afternoon - repeat EGD in 6 weeks per GI  Alcoholic cirrhosis of liver with ascites (HCC) - patient claims last drink end of May 2021; still grossly jaundiced on exam but TB less than previous, still elevated - resume spironolactone/lasix when able - continue lactulose when able; patient requesting assistance with lactulose due to price; will see if [facility] pharmacy an option and talk to SW  Secondary esophageal varices without bleeding (HCC) - no active bleed but felt to be culprit to patient's anemia - s/p 6 bands total to 3 varices found - will need BB at discharge; per GI, planning on propranolol 20 mg BID - repeat EGD in 6 weeks   Antimicrobials: Rocephin  DVT prophylaxis: SCD Code Status: Full Family Communication: none present Disposition Plan:  Status is: Inpatient  Remains inpatient appropriate because:Unsafe d/c plan, IV treatments appropriate due to intensity of illness or inability to take PO and Inpatient level of care appropriate due to severity of illness   Dispo: The patient is from: Home              Anticipated d/c is to: Home  Anticipated d/c date is: 2 days              Patient currently is not medically stable to d/c.  Objective: Blood pressure (!) 131/91, pulse 89, temperature 97.8 F (36.6 C), temperature source Oral, resp. rate 11, height 5\' 7"  (1.702 m), weight 54.4 kg, SpO2 96 %.  Examination: General appearance: alert, cooperative, fatigued, no distress and grossly jaundiced Head: Normocephalic, without obvious abnormality, atraumatic Eyes: scleral  icterus Lungs: clear to auscultation bilaterally Heart: regular rate and rhythm and S1, S2 normal Abdomen: normal findings: bowel sounds normal and soft, non-tender Extremities: no edema Skin: grossly jaundiced throughout Neurologic: Grossly normal  Consultants:   GI  Procedures:   07/26/20 Esophageal banding   Data Reviewed: I have personally reviewed following labs and imaging studies Results for orders placed or performed during the hospital encounter of 07/25/20 (from the past 24 hour(s))  Hemoglobin and hematocrit, blood     Status: Abnormal   Collection Time: 07/26/20  9:29 PM  Result Value Ref Range   Hemoglobin 8.6 (L) 13.0 - 17.0 g/dL   HCT 07/28/20 (L) 39 - 52 %  Folate, serum, performed at [facility] lab     Status: None   Collection Time: 07/27/20  4:48 AM  Result Value Ref Range   Folate 13.9 >5.9 ng/mL  CBC with Differential/Platelet     Status: Abnormal   Collection Time: 07/27/20  4:48 AM  Result Value Ref Range   WBC 20.3 (H) 4.0 - 10.5 K/uL   RBC 2.72 (L) 4.22 - 5.81 MIL/uL   Hemoglobin 8.9 (L) 13.0 - 17.0 g/dL   HCT 07/29/20 (L) 39 - 52 %   MCV 92.3 80.0 - 100.0 fL   MCH 32.7 26.0 - 34.0 pg   MCHC 35.5 30.0 - 36.0 g/dL   RDW 99.3 (H) 71.6 - 96.7 %   Platelets 113 (L) 150 - 400 K/uL   nRBC 0.0 0.0 - 0.2 %   Neutrophils Relative % 78 %   Neutro Abs 15.8 (H) 1.7 - 7.7 K/uL   Lymphocytes Relative 11 %   Lymphs Abs 2.2 0.7 - 4.0 K/uL   Monocytes Relative 9 %   Monocytes Absolute 1.9 (H) 0 - 1 K/uL   Eosinophils Relative 1 %   Eosinophils Absolute 0.3 0 - 0 K/uL   Basophils Relative 0 %   Basophils Absolute 0.1 0 - 0 K/uL   Immature Granulocytes 1 %   Abs Immature Granulocytes 0.18 (H) 0.00 - 0.07 K/uL  Magnesium     Status: None   Collection Time: 07/27/20  4:48 AM  Result Value Ref Range   Magnesium 2.2 1.7 - 2.4 mg/dL  Comprehensive metabolic panel     Status: Abnormal   Collection Time: 07/27/20  4:48 AM  Result Value Ref Range   Sodium 122 (L) 135  - 145 mmol/L   Potassium 4.8 3.5 - 5.1 mmol/L   Chloride 92 (L) 98 - 111 mmol/L   CO2 23 22 - 32 mmol/L   Glucose, Bld 106 (H) 70 - 99 mg/dL   BUN 24 (H) 6 - 20 mg/dL   Creatinine, Ser 07/29/20 0.61 - 1.24 mg/dL   Calcium 8.0 (L) 8.9 - 10.3 mg/dL   Total Protein 5.2 (L) 6.5 - 8.1 g/dL   Albumin 2.3 (L) 3.5 - 5.0 g/dL   AST 75 (H) 15 - 41 U/L   ALT 46 (H) 0 - 44 U/L   Alkaline Phosphatase 60 38 -  126 U/L   Total Bilirubin 6.8 (H) 0.3 - 1.2 mg/dL   GFR calc non Af Amer >60 >60 mL/min   GFR calc Af Amer >60 >60 mL/min   Anion gap 7 5 - 15    Recent Results (from the past 240 hour(s))  SARS Coronavirus 2 by RT PCR (hospital order, performed in [facility] hospital lab) Nasopharyngeal Nasopharyngeal Swab     Status: None   Collection Time: 07/25/20  1:04 PM   Specimen: Nasopharyngeal Swab  Result Value Ref Range Status   SARS Coronavirus 2 NEGATIVE NEGATIVE Final    Comment: (NOTE) SARS-CoV-2 target nucleic acids are NOT DETECTED.  The SARS-CoV-2 RNA is generally detectable in upper and lower respiratory specimens during the acute phase of infection. The lowest concentration of SARS-CoV-2 viral copies this assay can detect is 250 copies / mL. A negative result does not preclude SARS-CoV-2 infection and should not be used as the sole basis for treatment or other patient management decisions.  A negative result may occur with improper specimen collection / handling, submission of specimen other than nasopharyngeal swab, presence of viral mutation(s) within the areas targeted by this assay, and inadequate number of viral copies (<250 copies / mL). A negative result must be combined with clinical observations, patient history, and epidemiological information.  Fact Sheet for Patients:   BoilerBrush.com.cy  Fact Sheet for Healthcare Providers: https://pope.com/  This test is not yet approved or  cleared by the [place] FDA and has  been authorized for detection and/or diagnosis of SARS-CoV-2 by FDA under an Emergency Use Authorization (EUA).  This EUA will remain in effect (meaning this test can be used) for the duration of the COVID-19 declaration under Section 564(b)(1) of the Act, 21 U.S.C. section 360bbb-3(b)(1), unless the authorization is terminated or revoked sooner.  Performed at [facility], [address], [place], [place] [address]   Blood culture (routine x 2)     Status: None (Preliminary result)   Collection Time: 07/25/20  1:42 PM   Specimen: BLOOD LEFT HAND  Result Value Ref Range Status   Specimen Description   Final    BLOOD LEFT HAND Performed at [facility], [address], [place], [place] [address]    Special Requests   Final    BOTTLES DRAWN AEROBIC AND ANAEROBIC Blood Culture adequate volume Performed at [facility], [address], [place], [place] [address]    Culture   Final    NO GROWTH 2 DAYS Performed at [facility], [address] [place], [place] [address]    Report Status PENDING  Incomplete  Blood culture (routine x 2)     Status: None (Preliminary result)   Collection Time: 07/25/20  1:47 PM   Specimen: BLOOD  Result Value Ref Range Status   Specimen Description   Final    BLOOD RIGHT ANTECUBITAL Performed at [facility], [address], [place], [place] [address]    Special Requests   Final    BOTTLES DRAWN AEROBIC AND ANAEROBIC Blood Culture results may not be optimal due to an excessive volume of blood received in culture bottles Performed at [facility], [address], [place], [place] [address]    Culture   Final    NO GROWTH 2 DAYS Performed at [facility], [address], [place], [place] [address]    Report Status PENDING  Incomplete     Radiology  Studies: No results found. DG Chest Port 1 View  Final Result       Scheduled Meds: .  folic acid  1 mg Oral Daily  . multivitamin  15 mL Oral Daily  . nicotine  21 mg Transdermal Daily  . pantoprazole (PROTONIX) IV  40 mg Intravenous Q12H  . sodium chloride flush  3 mL Intravenous Q12H  . [START ON 07/28/2020] thiamine  100 mg Oral Daily   PRN Meds: sodium chloride, morphine injection, ondansetron **OR** ondansetron (ZOFRAN) IV, sodium chloride flush Continuous Infusions: . sodium chloride    . cefTRIAXone (ROCEPHIN)  IV Stopped (07/26/20 2157)  . folic acid (FOLVITE) IVPB    . octreotide  (SANDOSTATIN)    IV infusion 25 mcg/hr (07/27/20 1149)      LOS: 2 days  Time spent: Greater than 50% of the 35 minute visit was spent in counseling/coordination of care for the patient as laid out in the A&P.   Lewie Chamberavid Dayanne Yiu, MD [facility] 07/27/2020, 2:30 PM   Contact via secure chat.  To contact the attending provider between 7A-7P or the covering provider during after hours 7P-7A, please log into the web site [email or web address] and access using universal [place] password for that web site. If you do not have the password, please call the hospital operator.

## 2020-07-27 NOTE — Progress Notes (Signed)
Subjective: Patient is still in ER waiting for bed placement. He complains of discomfort in the sternal area post band ligation of esophageal varices and wants to have fruits/vegetables and diet to be advanced. He has not had any bowel movements and denies any further episodes of vomiting. He complains of pins-and-needles sensation in bilateral feet and both hands.  Objective: Vital signs in last 24 hours: Temp:  [97.6 F (36.4 C)-97.9 F (36.6 C)] 97.8 F (36.6 C) (08/17 2014) Pulse Rate:  [66-97] 97 (08/18 0738) Resp:  [10-20] 13 (08/18 0738) BP: (116-126)/(67-90) 117/80 (08/18 0738) SpO2:  [97 %-100 %] 100 % (08/18 0738) Weight change:     PE: Thinly built, lying comfortably on bed GENERAL: Mild icterus, mild pallor ABDOMEN: Nondistended, nontender, normoactive bowel sounds, soft EXTREMITIES: No edema, no deformity  Lab Results: Results for orders placed or performed during the hospital encounter of 07/25/20 (from the past 48 hour(s))  Type and screen     Status: None   Collection Time: 07/25/20 12:16 PM  Result Value Ref Range   ABO/RH(D) O POS    Antibody Screen NEG    Sample Expiration 07/28/2020,2359    Unit Number [id number]    Blood Component Type RED CELLS,LR    Unit division 00    Status of Unit ISSUED,FINAL    Transfusion Status OK TO TRANSFUSE    Crossmatch Result Compatible    Unit Number [id number]    Blood Component Type RED CELLS,LR    Unit division 00    Status of Unit ISSUED,FINAL    Transfusion Status OK TO TRANSFUSE    Crossmatch Result Compatible    Unit Number [id number]    Blood Component Type RED CELLS,LR    Unit division 00    Status of Unit ISSUED,FINAL    Transfusion Status OK TO TRANSFUSE    Crossmatch Result      Compatible Performed at [facility], [address] [place], [place] [address]   CBC with Differential/Platelet     Status: Abnormal   Collection Time: 07/25/20 12:21 PM  Result Value Ref Range    WBC 30.7 (H) 4.0 - 10.5 K/uL   RBC 1.09 (L) 4.22 - 5.81 MIL/uL   Hemoglobin 4.0 (LL) 13.0 - 17.0 g/dL    Comment: REPEATED TO VERIFY THIS CRITICAL RESULT HAS VERIFIED AND BEEN CALLED TO GARRISON,G. RN BY SARA VANHOORNE ON 08 16 2021 AT 1249, AND HAS BEEN READ BACK. CRITICAL RESULT VERIFIED    HCT 11.3 (L) 39 - 52 %   MCV 103.7 (H) 80.0 - 100.0 fL   MCH 36.7 (H) 26.0 - 34.0 pg   MCHC 35.4 30.0 - 36.0 g/dL   RDW 57.3 (H) 22.0 - 25.4 %   Platelets 169 150 - 400 K/uL   nRBC 0.0 0.0 - 0.2 %   Neutrophils Relative % 75 %   Neutro Abs 23.1 (H) 1.7 - 7.7 K/uL   Lymphocytes Relative 13 %   Lymphs Abs 4.0 0.7 - 4.0 K/uL   Monocytes Relative 9 %   Monocytes Absolute 2.8 (H) 0 - 1 K/uL   Eosinophils Relative 1 %   Eosinophils Absolute 0.2 0 - 0 K/uL   Basophils Relative 0 %   Basophils Absolute 0.0 0 - 0 K/uL   Immature Granulocytes 2 %   Abs Immature Granulocytes 0.58 (H) 0.00 - 0.07 K/uL   Acanthocytes PRESENT    Polychromasia PRESENT    Target Cells PRESENT  Comment: Performed at [facility], [address], [place], [place] [address]  Comprehensive metabolic panel     Status: Abnormal   Collection Time: 07/25/20 12:21 PM  Result Value Ref Range   Sodium 120 (L) 135 - 145 mmol/L   Potassium 4.5 3.5 - 5.1 mmol/L   Chloride 84 (L) 98 - 111 mmol/L   CO2 24 22 - 32 mmol/L   Glucose, Bld 137 (H) 70 - 99 mg/dL    Comment: Glucose reference range applies only to samples taken after fasting for at least 8 hours.   BUN 45 (H) 6 - 20 mg/dL   Creatinine, Ser 1.91 0.61 - 1.24 mg/dL   Calcium 8.3 (L) 8.9 - 10.3 mg/dL   Total Protein 5.0 (L) 6.5 - 8.1 g/dL   Albumin 2.3 (L) 3.5 - 5.0 g/dL   AST 83 (H) 15 - 41 U/L   ALT 41 0 - 44 U/L   Alkaline Phosphatase 58 38 - 126 U/L   Total Bilirubin 4.8 (H) 0.3 - 1.2 mg/dL   GFR calc non Af Amer >60 >60 mL/min   GFR calc Af Amer >60 >60 mL/min   Anion gap 12 5 - 15    Comment: Performed at [facility],  [address] [place], [place] [address]  Lipase, blood     Status: None   Collection Time: 07/25/20 12:21 PM  Result Value Ref Range   Lipase 39 11 - 51 U/L    Comment: Performed at [facility], [address] [place], [place] [address]  Ammonia     Status: Abnormal   Collection Time: 07/25/20 12:21 PM  Result Value Ref Range   Ammonia 37 (H) 9 - 35 umol/L    Comment: Performed at [facility], [address] [place], [place] [address]  Protime-INR     Status: Abnormal   Collection Time: 07/25/20 12:21 PM  Result Value Ref Range   Prothrombin Time 21.5 (H) 11.4 - 15.2 seconds   INR 1.9 (H) 0.8 - 1.2    Comment: (NOTE) INR goal varies based on device and disease states. Performed at [facility], [address] [place], [place] [address]   Prepare RBC (crossmatch)     Status: None   Collection Time: 07/25/20 12:52 PM  Result Value Ref Range   Order Confirmation      ORDER PROCESSED BY BLOOD BANK Performed at [facility], [address] [place], [place] [address]   POC occult blood, ED     Status: Abnormal   Collection Time: 07/25/20 12:55 PM  Result Value Ref Range   Fecal Occult Bld POSITIVE (A) NEGATIVE  SARS Coronavirus 2 by RT PCR (hospital order, performed in [facility] hospital lab) Nasopharyngeal Nasopharyngeal Swab     Status: None   Collection Time: 07/25/20  1:04 PM   Specimen: Nasopharyngeal Swab  Result Value Ref Range   SARS Coronavirus 2 NEGATIVE NEGATIVE    Comment: (NOTE) SARS-CoV-2 target nucleic acids are NOT DETECTED.  The SARS-CoV-2 RNA is generally detectable in upper and lower respiratory specimens during the acute phase of infection. The lowest concentration of SARS-CoV-2 viral copies this assay can detect is 250 copies / mL. A negative result does not preclude SARS-CoV-2 infection and should not be used as the sole basis for treatment or other patient management  decisions.  A negative result may occur with improper specimen collection / handling, submission of  specimen other than nasopharyngeal swab, presence of viral mutation(s) within the areas targeted by this assay, and inadequate number of viral copies (<250 copies / mL). A negative result must be combined with clinical observations, patient history, and epidemiological information.  Fact Sheet for Patients:   BoilerBrush.com.cyhttps://www.fda.gov/media/136312/download  Fact Sheet for Healthcare Providers: https://pope.com/https://www.fda.gov/media/136313/download  This test is not yet approved or  cleared by the [place] FDA and has been authorized for detection and/or diagnosis of SARS-CoV-2 by FDA under an Emergency Use Authorization (EUA).  This EUA will remain in effect (meaning this test can be used) for the duration of the COVID-19 declaration under Section 564(b)(1) of the Act, 21 U.S.C. section 360bbb-3(b)(1), unless the authorization is terminated or revoked sooner.  Performed at [facility], [address] [place], [place] [address]   Blood culture (routine x 2)     Status: None (Preliminary result)   Collection Time: 07/25/20  1:42 PM   Specimen: BLOOD LEFT HAND  Result Value Ref Range   Specimen Description      BLOOD LEFT HAND Performed at [facility], [address] [place], [place] [address]    Special Requests      BOTTLES DRAWN AEROBIC AND ANAEROBIC Blood Culture adequate volume Performed at [facility], [address] [place], [place] [address]    Culture      NO GROWTH 2 DAYS Performed at [facility], [address] [place], [place] [address]    Report Status PENDING   Blood culture (routine x 2)     Status: None (Preliminary result)   Collection Time: 07/25/20  1:47 PM   Specimen: BLOOD  Result Value Ref Range   Specimen Description      BLOOD RIGHT ANTECUBITAL Performed at [facility], [address] [place], [place] [address]    Special Requests      BOTTLES DRAWN AEROBIC AND ANAEROBIC Blood Culture results may not be optimal due to an excessive volume of blood received in culture bottles Performed at [facility], [address] [place], [place] [address]    Culture      NO GROWTH 2 DAYS Performed at [facility], [address] [place], [place] [address]    Report Status PENDING   Hemoglobin and hematocrit, blood     Status: Abnormal   Collection Time: 07/25/20  9:13 PM  Result Value Ref Range   Hemoglobin 6.6 (LL) 13.0 - 17.0 g/dL    Comment: POST TRANSFUSION SPECIMEN CRITICAL VALUE NOTED.  VALUE IS CONSISTENT WITH PREVIOUSLY REPORTED AND CALLED VALUE.    HCT 18.5 (L) 39 - 52 %    Comment: Performed at [facility], [address] [place], [place] [address]  CBC     Status: Abnormal   Collection Time: 07/26/20  4:16 AM  Result Value Ref Range   WBC 17.7 (H) 4.0 - 10.5 K/uL   RBC 1.94 (L) 4.22 - 5.81 MIL/uL   Hemoglobin 6.6 (LL) 13.0 - 17.0 g/dL    Comment: REPEATED TO VERIFY POST TRANSFUSION SPECIMEN CRITICAL VALUE NOTED.  VALUE IS CONSISTENT WITH PREVIOUSLY REPORTED AND CALLED VALUE.    HCT 18.5 (L) 39 - 52 %   MCV 95.4 80.0 - 100.0 fL    Comment: REPEATED TO VERIFY DELTA CHECK NOTED    MCH 34.0 26.0 - 34.0 pg   MCHC 35.7 30.0 - 36.0 g/dL   RDW 15.0  11.5 - 15.5 %   Platelets 80 (L) 150 - 400 K/uL    Comment: REPEATED TO VERIFY DELTA CHECK NOTED SPECIMEN CHECKED FOR CLOTS Immature Platelet Fraction may be clinically indicated, consider ordering this additional test [id number]    nRBC 0.0 0.0 - 0.2 %    Comment: Performed at [facility], [address] [place], [place] [address]  Basic metabolic panel     Status: Abnormal   Collection Time: 07/26/20  4:16 AM  Result Value Ref Range   Sodium 120 (L) 135 - 145 mmol/L   Potassium 5.4 (H) 3.5 - 5.1 mmol/L   Chloride 86 (L) 98 - 111  mmol/L   CO2 24 22 - 32 mmol/L   Glucose, Bld 110 (H) 70 - 99 mg/dL    Comment: Glucose reference range applies only to samples taken after fasting for at least 8 hours.   BUN 37 (H) 6 - 20 mg/dL   Creatinine, Ser 0.27 0.61 - 1.24 mg/dL   Calcium 7.9 (L) 8.9 - 10.3 mg/dL   GFR calc non Af Amer >60 >60 mL/min   GFR calc Af Amer >60 >60 mL/min   Anion gap 10 5 - 15    Comment: Performed at [facility], [address] [place], [place] [address]  Prepare RBC (crossmatch)     Status: None   Collection Time: 07/26/20  6:54 AM  Result Value Ref Range   Order Confirmation      ORDER PROCESSED BY BLOOD BANK Performed at [facility], [address] [place], [place] [address]   Hemoglobin and hematocrit, blood     Status: Abnormal   Collection Time: 07/26/20  1:45 PM  Result Value Ref Range   Hemoglobin 8.9 (L) 13.0 - 17.0 g/dL    Comment: POST TRANSFUSION SPECIMEN   HCT 24.3 (L) 39 - 52 %    Comment: Performed at [facility], [address] [place], [place] [address]  Hemoglobin and hematocrit, blood     Status: Abnormal   Collection Time: 07/26/20  9:29 PM  Result Value Ref Range   Hemoglobin 8.6 (L) 13.0 - 17.0 g/dL   HCT 56.3 (L) 39 - 52 %    Comment: Performed at [facility], [address] [place], [place] [address]  Folate, serum, performed at [facility] lab     Status: None   Collection Time: 07/27/20  4:48 AM  Result Value Ref Range   Folate 13.9 >5.9 ng/mL    Comment: Performed at [facility], [address] [place], [place] [address]  CBC with Differential/Platelet     Status: Abnormal   Collection Time: 07/27/20  4:48 AM  Result Value Ref Range   WBC 20.3 (H) 4.0 - 10.5 K/uL   RBC 2.72 (L) 4.22 - 5.81 MIL/uL   Hemoglobin 8.9 (L) 13.0 - 17.0 g/dL   HCT 18.8 (L) 39 - 52 %   MCV 92.3 80.0 - 100.0 fL   MCH 32.7 26.0 - 34.0 pg   MCHC 35.5 30.0 - 36.0 g/dL   RDW 41.6 (H)  60.6 - 15.5 %   Platelets 113 (L) 150 - 400 K/uL    Comment: REPEATED TO VERIFY PLATELET COUNT CONFIRMED BY SMEAR Immature Platelet Fraction may be clinically indicated, consider ordering this additional test [id number]    nRBC 0.0 0.0 - 0.2 %   Neutrophils Relative % 78 %   Neutro Abs 15.8 (H)  1.7 - 7.7 K/uL   Lymphocytes Relative 11 %   Lymphs Abs 2.2 0.7 - 4.0 K/uL   Monocytes Relative 9 %   Monocytes Absolute 1.9 (H) 0 - 1 K/uL   Eosinophils Relative 1 %   Eosinophils Absolute 0.3 0 - 0 K/uL   Basophils Relative 0 %   Basophils Absolute 0.1 0 - 0 K/uL   Immature Granulocytes 1 %   Abs Immature Granulocytes 0.18 (H) 0.00 - 0.07 K/uL    Comment: Performed at [facility], [address] [place], [place] [address]  Magnesium     Status: None   Collection Time: 07/27/20  4:48 AM  Result Value Ref Range   Magnesium 2.2 1.7 - 2.4 mg/dL    Comment: Performed at [facility], [address], [place], [place] [address]  Comprehensive metabolic panel     Status: Abnormal   Collection Time: 07/27/20  4:48 AM  Result Value Ref Range   Sodium 122 (L) 135 - 145 mmol/L   Potassium 4.8 3.5 - 5.1 mmol/L   Chloride 92 (L) 98 - 111 mmol/L   CO2 23 22 - 32 mmol/L   Glucose, Bld 106 (H) 70 - 99 mg/dL    Comment: Glucose reference range applies only to samples taken after fasting for at least 8 hours.   BUN 24 (H) 6 - 20 mg/dL   Creatinine, Ser 9.89 0.61 - 1.24 mg/dL   Calcium 8.0 (L) 8.9 - 10.3 mg/dL   Total Protein 5.2 (L) 6.5 - 8.1 g/dL   Albumin 2.3 (L) 3.5 - 5.0 g/dL   AST 75 (H) 15 - 41 U/L   ALT 46 (H) 0 - 44 U/L   Alkaline Phosphatase 60 38 - 126 U/L   Total Bilirubin 6.8 (H) 0.3 - 1.2 mg/dL   GFR calc non Af Amer >60 >60 mL/min   GFR calc Af Amer >60 >60 mL/min   Anion gap 7 5 - 15    Comment: Performed at [facility], [address] [place], [place] [address]    Studies/Results: DG Chest Port 1 View  Result Date:  07/25/2020 CLINICAL DATA:  Syncope EXAM: PORTABLE CHEST 1 VIEW COMPARISON:  July 02, 2020 FINDINGS: The lungs are clear. The heart size and pulmonary vascularity are normal. No adenopathy. No pneumothorax. No bone lesions. IMPRESSION: Lungs clear.  Cardiac silhouette within normal limits. Electronically Signed   By: Bretta Bang III M.D.   On: 07/25/2020 13:07    Medications: I have reviewed the patient's current medications.  Assessment: Decompensated alcohol-related cirrhosis Esophageal varices status post 6 band ligation performed yesterday, no further episodes of bleeding, hemoglobin stable at 8.9 Diffuse portal hypertensive gastropathy with self-limited oozing from stomach and duodenum Hyponatremia  Plan: Continue IV octreotide for another 24 hours, plan to switch to nonselective beta-blocker like propanolol 20 mg p.o. twice daily tomorrow Continue IV ceftriaxone for now Continue pantoprazole 40 mg every 12 hours Patient is on folic acid, multivitamin and thiamine, but complains of symptoms compatible with peripheral neuropathy, could be related to vitamin B12 deficiency, may benefit from vitamin B12 injections while in the hospital. Hopefully can be discharged tomorrow if remains stable. We will advance his diet to regular.  Kerin Salen, MD 07/27/2020, 10:29 AM

## 2020-07-27 NOTE — TOC Initial Note (Signed)
Transition of Care [facility]) - Initial/Assessment Note    Patient Details  Name: Matthew [name] MRN: [id number] Date of Birth: 1986-12-02  Transition of Care [facility] CM/SW Contact:    Elliot Cousin, RN Phone Number: [phone] 07/27/2020, 6:35 PM  Clinical Narrative:                 TOC CM spoke to pt at bedside. States he lives alone. He has a car to drive to his appt. Appt arranged at [facility] on 08/18/2020 at 1:30 pm. Will have pt's medications sent to [facility] pharmacy. Explained to pt that he will receive 1st fill for free and subsequent RX will be at a discounted rate. Pt did not want to provide a emergency contact but states he has a neighbor that assist him at home.   If dc on weekend, will utilize [facility] program for pt to get meds at a $ copay with use of program once per year.     Expected Discharge Plan: Home/Self Care Barriers to Discharge: Continued Medical Work up   Patient Goals and CMS Choice Patient states their goals for this hospitalization and ongoing recovery are:: to return home CMS Medicare.gov Compare Post Acute Care list provided to:: Patient Choice offered to / list presented to : Patient  Expected Discharge Plan and Services Expected Discharge Plan: Home/Self Care       Living arrangements for the past 2 months: Apartment                                      Prior Living Arrangements/Services Living arrangements for the past 2 months: Apartment Lives with:: Self Patient language and need for interpreter reviewed:: No        Need for Family Participation in Patient Care: No (Comment) Care giver support system in place?: No (comment)   Criminal Activity/Legal Involvement Pertinent to Current Situation/Hospitalization: No - Comment as needed  Activities of Daily Living Home Assistive Devices/Equipment: None ADL Screening (condition at time of admission) Patient's cognitive ability adequate to safely complete daily activities?: Yes Is the  patient deaf or have difficulty hearing?: Yes Does the patient have difficulty seeing, even when wearing glasses/contacts?: No Does the patient have difficulty concentrating, remembering, or making decisions?: No Patient able to express need for assistance with ADLs?: Yes Does the patient have difficulty dressing or bathing?: No Independently performs ADLs?: Yes (appropriate for developmental age) Does the patient have difficulty walking or climbing stairs?: No Weakness of Legs: Both Weakness of Arms/Hands: Both  Permission Sought/Granted Permission sought to share information with : Case Manager, PCP                Emotional Assessment Appearance:: Appears stated age Attitude/Demeanor/Rapport: Engaged Affect (typically observed): Accepting Orientation: : Oriented to Self, Oriented to  Time, Oriented to Place, Oriented to Situation   Psych Involvement: No (comment)  Admission diagnosis:  Hyponatremia [E87.1] Acute lower GI bleeding [K92.2] GI bleed [K92.2] Acute upper GI bleed [K92.2] Symptomatic anemia [D64.9] Patient Active Problem List   Diagnosis Date Noted  . Secondary esophageal varices without bleeding (HCC) 07/26/2020  . Acute lower GI bleeding 07/25/2020  . Alcoholic cirrhosis of liver with ascites (HCC) 07/25/2020  . GI bleed 07/25/2020  . Protein-calorie malnutrition, severe 07/04/2020  . Hepatic encephalopathy (HCC) 07/02/2020  . Alcohol use disorder, severe, in early remission (HCC) 07/02/2020  . Tobacco abuse 07/02/2020  .  Ascites of liver 06/12/2020  . Hyperbilirubinemia 06/12/2020  . Elevated AST (SGOT) 06/12/2020  . Hepatic cirrhosis (HCC) 06/12/2020  . Leukocytosis 06/12/2020  . Symptomatic anemia 06/12/2020  . Hypoalbuminemia 06/12/2020   PCP:  Patient, No Pcp Per Pharmacy:   [id number] - [place], [place] - [address] [place] [address] Phone: [phone] Fax:  [phone]     Social Determinants of Health (SDOH) Interventions    Readmission Risk Interventions No flowsheet data found.

## 2020-07-28 ENCOUNTER — Other Ambulatory Visit (HOSPITAL_COMMUNITY): Payer: Self-pay | Admitting: Internal Medicine

## 2020-07-28 LAB — CBC WITH DIFFERENTIAL/PLATELET
Abs Immature Granulocytes: 0.14 10*3/uL — ABNORMAL HIGH (ref 0.00–0.07)
Basophils Absolute: 0 10*3/uL (ref 0.0–0.1)
Basophils Relative: 0 %
Eosinophils Absolute: 0.3 10*3/uL (ref 0.0–0.5)
Eosinophils Relative: 2 %
HCT: 25.4 % — ABNORMAL LOW (ref 39.0–52.0)
Hemoglobin: 8.7 g/dL — ABNORMAL LOW (ref 13.0–17.0)
Immature Granulocytes: 1 %
Lymphocytes Relative: 8 %
Lymphs Abs: 1.4 10*3/uL (ref 0.7–4.0)
MCH: 32.6 pg (ref 26.0–34.0)
MCHC: 34.3 g/dL (ref 30.0–36.0)
MCV: 95.1 fL (ref 80.0–100.0)
Monocytes Absolute: 1.5 10*3/uL — ABNORMAL HIGH (ref 0.1–1.0)
Monocytes Relative: 9 %
Neutro Abs: 12.9 10*3/uL — ABNORMAL HIGH (ref 1.7–7.7)
Neutrophils Relative %: 80 %
Platelets: 126 10*3/uL — ABNORMAL LOW (ref 150–400)
RBC: 2.67 MIL/uL — ABNORMAL LOW (ref 4.22–5.81)
RDW: 17.9 % — ABNORMAL HIGH (ref 11.5–15.5)
WBC: 16.3 10*3/uL — ABNORMAL HIGH (ref 4.0–10.5)
nRBC: 0 % (ref 0.0–0.2)

## 2020-07-28 LAB — COMPREHENSIVE METABOLIC PANEL
ALT: 44 U/L (ref 0–44)
AST: 67 U/L — ABNORMAL HIGH (ref 15–41)
Albumin: 2.4 g/dL — ABNORMAL LOW (ref 3.5–5.0)
Alkaline Phosphatase: 76 U/L (ref 38–126)
Anion gap: 7 (ref 5–15)
BUN: 16 mg/dL (ref 6–20)
CO2: 23 mmol/L (ref 22–32)
Calcium: 7.9 mg/dL — ABNORMAL LOW (ref 8.9–10.3)
Chloride: 94 mmol/L — ABNORMAL LOW (ref 98–111)
Creatinine, Ser: 0.67 mg/dL (ref 0.61–1.24)
GFR calc Af Amer: >60 mL/min (ref >60)
GFR calc non Af Amer: >60 mL/min (ref >60)
Glucose, Bld: 114 mg/dL — ABNORMAL HIGH (ref 70–99)
Potassium: 4.7 mmol/L (ref 3.5–5.1)
Sodium: 124 mmol/L — ABNORMAL LOW (ref 135–145)
Total Bilirubin: 6 mg/dL — ABNORMAL HIGH (ref 0.3–1.2)
Total Protein: 5.3 g/dL — ABNORMAL LOW (ref 6.5–8.1)

## 2020-07-28 LAB — MAGNESIUM: Magnesium: 2.2 mg/dL (ref 1.7–2.4)

## 2020-07-28 LAB — VITAMIN B12: Vitamin B-12: 1989 pg/mL — ABNORMAL HIGH (ref 180–914)

## 2020-07-28 MED ORDER — NADOLOL 20 MG PO TABS
20.0000 mg | ORAL_TABLET | Freq: Every day | ORAL | Status: DC
  Administered 2020-07-28: 20 mg via ORAL
  Filled 2020-07-28: qty 1

## 2020-07-28 MED ORDER — PANTOPRAZOLE SODIUM 40 MG PO TBEC
40.0000 mg | DELAYED_RELEASE_TABLET | Freq: Every day | ORAL | 1 refills | Status: DC
Start: 2020-07-28 — End: 2020-08-18

## 2020-07-28 MED ORDER — NADOLOL 20 MG PO TABS: 20.0000 mg | ORAL_TABLET | Freq: Every day | ORAL | 3 refills | Status: DC

## 2020-07-28 MED ORDER — LACTULOSE 10 GM/15ML PO SOLN: 15.0000 g | Freq: Every day | ORAL | 2 refills | Status: DC

## 2020-07-28 MED FILL — PANTOPRAZOLE SOD DR 40 MG T: 40 | 30 days supply | Qty: 30 | Fill #0

## 2020-07-28 MED FILL — NADOLOL 20 MG TAB: 20 | 30 days supply | Qty: 30 | Fill #0

## 2020-07-28 MED FILL — LACTULOSE 10 GM/15 ML SOLN: 10 | 21 days supply | Qty: 473 | Fill #0

## 2020-07-28 NOTE — Progress Notes (Signed)
Subjective: Patient had small brown bowel movements, denies further episodes of coffee-ground emesis or black stools. Was able to tolerate regular diet without associated nausea, vomiting or abdominal pain.  Objective: Vital signs in last 24 hours: Temp:  [98.3 F (36.8 C)] 98.3 F (36.8 C) (08/18 2159) Pulse Rate:  [72-99] 86 (08/19 0914) Resp:  [9-17] 12 (08/19 0914) BP: (101-131)/(69-96) 128/96 (08/19 0914) SpO2:  [96 %-100 %] 98 % (08/19 0914) Weight change:     PE: Icteric, mild pallor GENERAL: Not in distress, able to speak in full sentences ABDOMEN: Soft, nondistended, nontender, normoactive bowel sounds no evidence of ascites EXTREMITIES: No edema  Lab Results: Results for orders placed or performed during the hospital encounter of 07/25/20 (from the past 48 hour(s))  Hemoglobin and hematocrit, blood     Status: Abnormal   Collection Time: 07/26/20  1:45 PM  Result Value Ref Range   Hemoglobin 8.9 (L) 13.0 - 17.0 g/dL    Comment: POST TRANSFUSION SPECIMEN   HCT 24.3 (L) 39 - 52 %    Comment: Performed at [facility], [address], [place], [place] [address]  Hemoglobin and hematocrit, blood     Status: Abnormal   Collection Time: 07/26/20  9:29 PM  Result Value Ref Range   Hemoglobin 8.6 (L) 13.0 - 17.0 g/dL   HCT 70.9 (L) 39 - 52 %    Comment: Performed at [facility], [address] [place], [place] [address]  Folate, serum, performed at [facility] lab     Status: None   Collection Time: 07/27/20  4:48 AM  Result Value Ref Range   Folate 13.9 >5.9 ng/mL    Comment: Performed at [facility], [address], [place], [place] [address]  CBC with Differential/Platelet     Status: Abnormal   Collection Time: 07/27/20  4:48 AM  Result Value Ref Range   WBC 20.3 (H) 4.0 - 10.5 K/uL   RBC 2.72 (L) 4.22 - 5.81 MIL/uL   Hemoglobin 8.9 (L) 13.0 - 17.0 g/dL   HCT 65.4 (L) 39 - 52 %   MCV 92.3 80.0 - 100.0 fL    MCH 32.7 26.0 - 34.0 pg   MCHC 35.5 30.0 - 36.0 g/dL   RDW 65.0 (H) 35.4 - 65.6 %   Platelets 113 (L) 150 - 400 K/uL    Comment: REPEATED TO VERIFY PLATELET COUNT CONFIRMED BY SMEAR Immature Platelet Fraction may be clinically indicated, consider ordering this additional test [id number]    nRBC 0.0 0.0 - 0.2 %   Neutrophils Relative % 78 %   Neutro Abs 15.8 (H) 1.7 - 7.7 K/uL   Lymphocytes Relative 11 %   Lymphs Abs 2.2 0.7 - 4.0 K/uL   Monocytes Relative 9 %   Monocytes Absolute 1.9 (H) 0 - 1 K/uL   Eosinophils Relative 1 %   Eosinophils Absolute 0.3 0 - 0 K/uL   Basophils Relative 0 %   Basophils Absolute 0.1 0 - 0 K/uL   Immature Granulocytes 1 %   Abs Immature Granulocytes 0.18 (H) 0.00 - 0.07 K/uL    Comment: Performed at [facility], [address] [place], [place] [address]  Magnesium     Status: None   Collection Time: 07/27/20  4:48 AM  Result Value Ref Range   Magnesium 2.2 1.7 - 2.4 mg/dL    Comment: Performed at [facility], [address], [place], [place] [address]  Comprehensive metabolic panel     Status: Abnormal   Collection Time: 07/27/20  4:48 AM  Result Value Ref Range   Sodium 122 (L) 135 - 145 mmol/L   Potassium 4.8 3.5 - 5.1 mmol/L   Chloride 92 (L) 98 - 111 mmol/L   CO2 23 22 - 32 mmol/L   Glucose, Bld 106 (H) 70 - 99 mg/dL    Comment: Glucose reference range applies only to samples taken after fasting for at least 8 hours.   BUN 24 (H) 6 - 20 mg/dL   Creatinine, Ser 5.17 0.61 - 1.24 mg/dL   Calcium 8.0 (L) 8.9 - 10.3 mg/dL   Total Protein 5.2 (L) 6.5 - 8.1 g/dL   Albumin 2.3 (L) 3.5 - 5.0 g/dL   AST 75 (H) 15 - 41 U/L   ALT 46 (H) 0 - 44 U/L   Alkaline Phosphatase 60 38 - 126 U/L   Total Bilirubin 6.8 (H) 0.3 - 1.2 mg/dL   GFR calc non Af Amer >60 >60 mL/min   GFR calc Af Amer >60 >60 mL/min   Anion gap 7 5 - 15    Comment: Performed at [facility], [address],  [place], [place] [address]  CBC with Differential/Platelet     Status: Abnormal   Collection Time: 07/28/20  5:00 AM  Result Value Ref Range   WBC 16.3 (H) 4.0 - 10.5 K/uL   RBC 2.67 (L) 4.22 - 5.81 MIL/uL   Hemoglobin 8.7 (L) 13.0 - 17.0 g/dL   HCT 94.4 (L) 39 - 52 %   MCV 95.1 80.0 - 100.0 fL   MCH 32.6 26.0 - 34.0 pg   MCHC 34.3 30.0 - 36.0 g/dL   RDW 96.7 (H) 59.1 - 63.8 %   Platelets 126 (L) 150 - 400 K/uL    Comment: Immature Platelet Fraction may be clinically indicated, consider ordering this additional test [id number]    nRBC 0.0 0.0 - 0.2 %   Neutrophils Relative % 80 %   Neutro Abs 12.9 (H) 1.7 - 7.7 K/uL   Lymphocytes Relative 8 %   Lymphs Abs 1.4 0.7 - 4.0 K/uL   Monocytes Relative 9 %   Monocytes Absolute 1.5 (H) 0 - 1 K/uL   Eosinophils Relative 2 %   Eosinophils Absolute 0.3 0 - 0 K/uL   Basophils Relative 0 %   Basophils Absolute 0.0 0 - 0 K/uL   Immature Granulocytes 1 %   Abs Immature Granulocytes 0.14 (H) 0.00 - 0.07 K/uL    Comment: Performed at [facility], [address], [place], [place] [address]  Magnesium     Status: None   Collection Time: 07/28/20  5:00 AM  Result Value Ref Range   Magnesium 2.2 1.7 - 2.4 mg/dL    Comment: Performed at [facility], [address], [place], [place] [address]  Comprehensive metabolic panel     Status: Abnormal   Collection Time: 07/28/20  5:00 AM  Result Value Ref Range   Sodium 124 (L) 135 - 145 mmol/L   Potassium 4.7 3.5 - 5.1 mmol/L   Chloride 94 (L) 98 - 111 mmol/L   CO2 23 22 - 32 mmol/L   Glucose, Bld 114 (H) 70 - 99 mg/dL    Comment: Glucose reference range applies only to samples taken after fasting for at least 8 hours.   BUN 16 6 - 20 mg/dL   Creatinine, Ser 0.09 0.61 - 1.24  mg/dL   Calcium 7.9 (L) 8.9 - 10.3 mg/dL   Total Protein 5.3 (L) 6.5 - 8.1 g/dL   Albumin 2.4 (L) 3.5 - 5.0 g/dL   AST 67 (H) 15 - 41 U/L   ALT 44 0 - 44 U/L   Alkaline Phosphatase 76 38 -  126 U/L   Total Bilirubin 6.0 (H) 0.3 - 1.2 mg/dL   GFR calc non Af Amer >60 >60 mL/min   GFR calc Af Amer >60 >60 mL/min   Anion gap 7 5 - 15    Comment: Performed at [facility], [address] [place], [place] [address]    Studies/Results: No results found.  Medications: I have reviewed the patient's current medications.  Assessment: Decompensated alcohol-related cirrhosis Upper GI bleeding secondary to esophageal varices and portal hypertensive gastropathy, status post EGD and banding, hemoglobin stable at 8.7 History of ascites History of encephalopathy  Plan: Okay to discontinue IV octreotide Patient to be discharged on propanolol 20 mg p.o. twice daily and pantoprazole 40 mg once daily. Patient to resume outpatient medications, Lasix 40 mg p.o. daily, spironolactone 100 mg p.o. daily and lactulose 15 mL p.o. daily. I will make arrangements for outpatient EGD for retreatment of esophageal varices in 6 to 8 weeks as an outpatient. Discussed the same with the patient who verbalized understanding. He has not had alcohol since May 2021, emphasized on importance of remaining abstinent from alcohol use.  Kerin Salen, MD 07/28/2020, 10:30 AM

## 2020-07-28 NOTE — Evaluation (Signed)
Physical Therapy Evaluation Patient Details Name: Matthew [name] MRN: [id number] DOB: 02-Jun-1986 Today's Date: 07/28/2020   History of Present Illness  Matthew [name] is a 34 y.o. male with PMH alcoholic cirrhosis with ascites who presented to the hospital with weight loss and generalized weakness, hgb of 4.0.Recently had undergone a paracentesis x2 in July.Upper GI bleeding secondary to esophageal varices and portal hypertensive gastropathy, status post EGD and banding,  Clinical Impression  The patient ambulated without AD, reports tingling in feet is limiting gait. Patient ambluates slowly, no overt balance loss. Offered to get an assistive device, patient declines. Patient is focussed on going home. No further PT needs at this time. Please reconsult if condition changes and PT intervention is indicated. Noted sodium 124, HG 8.7. Patient reports no dizziness during ambulation.     Follow Up Recommendations No PT follow up    Equipment Recommendations  None recommended by PT    Recommendations for Other Services       Precautions / Restrictions Precautions Precautions: Fall Precaution Comments: tingling feet      Mobility  Bed Mobility Overal bed mobility: Independent                Transfers Overall transfer level: Modified independent Equipment used: None                Ambulation/Gait Ambulation/Gait assistance: Modified independent (Device/Increase time) Gait Distance (Feet): 100 Feet Assistive device: None Gait Pattern/deviations: Step-to pattern;Step-through pattern;Drifts right/left Gait velocity: decr   General Gait Details: pt. requested no external support, reports gait dysfunction due to pins and needles in feet. Patient limited distance due to sensory. Patient ambulated slowly and guaredely.  Stairs            Wheelchair Mobility    Modified Rankin (Stroke Patients Only)       Balance Overall balance assessment: Mild  deficits observed, not formally tested                                           Pertinent Vitals/Pain Pain Assessment: 0-10 Pain Score: 7  Pain Location: both feet Pain Descriptors / Indicators: Tingling;Pins and needles Pain Intervention(s): Limited activity within patient's tolerance;Monitored during session    Home Living Family/patient expects to be discharged to:: Private residence Living Arrangements: Alone Available Help at Discharge: Friend(s) Type of Home: Apartment Home Access: Stairs to enter Entrance Stairs-Rails: Doctor, general practice of Steps: 12 Home Layout: One level Home Equipment: None Additional Comments: staes  that he has friends who can assist/ very vague and not forthwithcoming-states he just wants to go home    Prior Function Level of Independence: Independent               Hand Dominance        Extremity/Trunk Assessment   Upper Extremity Assessment Upper Extremity Assessment: Generalized weakness    Lower Extremity Assessment Lower Extremity Assessment: Generalized weakness    Cervical / Trunk Assessment Cervical / Trunk Assessment: Normal  Communication   Communication: No difficulties  Cognition Arousal/Alertness: Awake/alert Behavior During Therapy: WFL for tasks assessed/performed Overall Cognitive Status: Within Functional Limits for tasks assessed                                        General Comments  Exercises     Assessment/Plan    PT Assessment Patent does not need any further PT services  PT Problem List         PT Treatment Interventions      PT Goals (Current goals can be found in the Care Plan section)  Acute Rehab PT Goals Patient Stated Goal: to get my feet worm, go home PT Goal Formulation: All assessment and education complete, DC therapy    Frequency     Barriers to discharge        Co-evaluation               AM-PAC PT "6 Clicks"  Mobility  Outcome Measure Help needed turning from your back to your side while in a flat bed without using bedrails?: None Help needed moving from lying on your back to sitting on the side of a flat bed without using bedrails?: None Help needed moving to and from a bed to a chair (including a wheelchair)?: None Help needed standing up from a chair using your arms (e.g., wheelchair or bedside chair)?: None Help needed to walk in hospital room?: None Help needed climbing 3-5 steps with a railing? : A Little 6 Click Score: 23    End of Session   Activity Tolerance: Patient limited by fatigue;Patient limited by pain Patient left: in bed;with call bell/phone within reach Nurse Communication: Mobility status PT Visit Diagnosis: Unsteadiness on feet (R26.81);Difficulty in walking, not elsewhere classified (R26.2);Other symptoms and signs involving the nervous system (R29.898)    Time: 5747-3403 PT Time Calculation (min) (ACUTE ONLY): 13 min   Charges:   PT Evaluation $PT Eval Low Complexity: 1 Low          Blanchard Kelch PT Acute Rehabilitation Services Pager [phone] Office [phone]   Rada Hay 07/28/2020, 11:31 AM

## 2020-07-28 NOTE — TOC Transition Note (Signed)
Transition of Care [facility]) - CM/SW Discharge Note   Patient Details  Name: Matthew [name] MRN: [id number] Date of Birth: 1986-12-10  Transition of Care [facility]) CM/SW Contact:  Elliot Cousin, RN Phone Number: [phone] 07/28/2020, 1:02 PM   Clinical Narrative:     TOC CM spoke to pt and [facility] [employer] form completed and emailed. Contacted [facility] and medication are ready and pt can utilize the free fill . Explained to pt that option is only available once and he will need to speak to financial counselor at [facility] for future assistance with medications. Pt states he does have Medicaid and Disability application pending from past hospital visit. Will send message to [employer] to follow up on applications.      Barriers to Discharge: No Barriers Identified   Patient Goals and CMS Choice Patient states their goals for this hospitalization and ongoing recovery are:: to return home CMS Medicare.gov Compare Post Acute Care list provided to:: Patient Choice offered to / list presented to : Patient  Discharge Placement                       Discharge Plan and Services                                     Social Determinants of Health (SDOH) Interventions     Readmission Risk Interventions No flowsheet data found.

## 2020-07-28 NOTE — ED Notes (Signed)
Pt in room A&O x4. NAD denies any needs at this time. Updated on plan of care. Pt on regular diet with no difficulty swallowing. 5 Ps addressed.

## 2020-07-28 NOTE — Discharge Instructions (Signed)
Anemia  Anemia is a condition in which you do not have enough red blood cells or hemoglobin. Hemoglobin is a substance in red blood cells that carries oxygen. When you do not have enough red blood cells or hemoglobin (are anemic), your body cannot get enough oxygen and your organs may not work properly. As a result, you may feel very tired or have other problems. What are the causes? Common causes of anemia include:  Excessive bleeding. Anemia can be caused by excessive bleeding inside or outside the body, including bleeding from the intestine or from periods in women.  Poor nutrition.  Long-lasting (chronic) kidney, thyroid, and liver disease.  Bone marrow disorders.  Cancer and treatments for cancer.  HIV (human immunodeficiency virus) and AIDS (acquired immunodeficiency syndrome).  Treatments for HIV and AIDS.  Spleen problems.  Blood disorders.  Infections, medicines, and autoimmune disorders that destroy red blood cells. What are the signs or symptoms? Symptoms of this condition include:  Minor weakness.  Dizziness.  Headache.  Feeling heartbeats that are irregular or faster than normal (palpitations).  Shortness of breath, especially with exercise.  Paleness.  Cold sensitivity.  Indigestion.  Nausea.  Difficulty sleeping.  Difficulty concentrating. Symptoms may occur suddenly or develop slowly. If your anemia is mild, you may not have symptoms. How is this diagnosed? This condition is diagnosed based on:  Blood tests.  Your medical history.  A physical exam.  Bone marrow biopsy. Your health care provider may also check your stool (feces) for blood and may do additional testing to look for the cause of your bleeding. You may also have other tests, including:  Imaging tests, such as a CT scan or MRI.  Endoscopy.  Colonoscopy. How is this treated? Treatment for this condition depends on the cause. If you continue to lose a lot of blood, you may  need to be treated at a hospital. Treatment may include:  Taking supplements of iron, vitamin S31, or folic acid.  Taking a hormone medicine (erythropoietin) that can help to stimulate red blood cell growth.  Having a blood transfusion. This may be needed if you lose a lot of blood.  Making changes to your diet.  Having surgery to remove your spleen. Follow these instructions at home:  Take over-the-counter and prescription medicines only as told by your health care provider.  Take supplements only as told by your health care provider.  Follow any diet instructions that you were given.  Keep all follow-up visits as told by your health care provider. This is important. Contact a health care provider if:  You develop new bleeding anywhere in the body. Get help right away if:  You are very weak.  You are short of breath.  You have pain in your abdomen or chest.  You are dizzy or feel faint.  You have trouble concentrating.  You have bloody or black, tarry stools.  You vomit repeatedly or you vomit up blood. Summary  Anemia is a condition in which you do not have enough red blood cells or enough of a substance in your red blood cells that carries oxygen (hemoglobin).  Symptoms may occur suddenly or develop slowly.  If your anemia is mild, you may not have symptoms.  This condition is diagnosed with blood tests as well as a medical history and physical exam. Other tests may be needed.  Treatment for this condition depends on the cause of the anemia. This information is not intended to replace advice given to you by  your health care provider. Make sure you discuss any questions you have with your health care provider. Document Revised: 11/08/2017 Document Reviewed: 12/28/2016 Elsevier Patient Education  [employer].

## 2020-07-28 NOTE — Discharge Summary (Signed)
Physician Discharge Summary  Matthew [name] ZOX:[id number] DOB: July 01, 1986 DOA: 07/25/2020  PCP: Patient, No Pcp Per  Admit date: 07/25/2020 Discharge date: 07/28/2020  Admitted From: home Disposition:  home Discharging physician: Lewie Chamber, MD  Recommendations for Outpatient Follow-up:  1. Increase nadolol at followup if tolerating 2. Follow up with GI for repeat EGD in 6 weeks  Patient discharged to home in Discharge Condition: stable CODE STATUS: Full Diet recommendation:  Diet Orders (From admission, onward)    Start     Ordered   07/28/20 0000  Diet - low sodium heart healthy        07/28/20 1050   07/27/20 1034  Diet regular Room service appropriate? Yes; Fluid consistency: Thin  Diet effective now       Question Answer Comment  Room service appropriate? Yes   Fluid consistency: Thin      07/27/20 1033          Hospital Course: Matthew [name] is a 34 y.o. male with PMH alcoholic cirrhosis with ascites who presented to the hospital with weight loss and generalized weakness. He had recently undergone a paracentesis x2 in July and states that he has been on spironolactone and Lasix at home.  He also states that he still has not had anything to drink since the end of May. He underwent work-up in the ER and was found to have a hemoglobin of 4 g/dL.  He underwent total of 3 units of blood transfusions and repeat Hgb had improved to 8.9 g/dL.  GI was consulted and he was taken for urgent EGD.  He was found to have grade III esophageal varices which were treated with 6 bands in total.  He was also found to have hypertensive gastropathy and hypertensive duodenopathy.  He is recommended to have a repeat EGD in 6 weeks. He was also started on Rocephin and octreotide on admission. He completed 72 hour octreotide infusion. At discharge he was continued on lasix, spironolactone, started on nadolol for ease in dosing (can be uptitrated at follow up if tolerating), lactulose,  and protonix.  His Hgb remained stable as well as vitals and he was considered stable for discharge with ongoing outpatient management.    Symptomatic anemia -Considered due to slowly bleeding esophageal varices and underlying portal hypertension -s/p 3 units total PRBC for admission so far - trend H/H transfuse further if Hgb<7 g/dL -Patient was also evaluated by physical therapy prior to discharge and considered independent/stable enough for discharging home  Protein-calorie malnutrition, severe - continue diet - patient encouraged for adequate nutrition on d/c - continue vitamins at discharge   GI bleed - 3 esophageal varices found on EGD on 8/17; also hypertensive gastro/duodenopathy - trending H/H, see anemia - continue Rocephin in setting of alcoholic cirrhosis - continue Octreotide x 72 hours; tentative d/c home on 8/19 in the afternoon - repeat EGD in 6 weeks per GI  Alcoholic cirrhosis of liver with ascites (HCC) - patient claims last drink end of May 2021; still grossly jaundiced on exam but TB less than previous, still elevated - resume spironolactone/lasix when able - continue lactulose when able; patient requesting assistance with lactulose due to price; discussed with social work.  Prescriptions sent to [facility] pharmacy  Secondary esophageal varices without bleeding (HCC) - no active bleed but felt to be culprit to patient's anemia - s/p 6 bands total to 3 varices found - will need BB at discharge; patient discharged on nadolol 20 mg daily.  If tolerating at  outpatient follow-up, can be further increased - repeat EGD in 6 weeks    The patient's chronic medical conditions were treated accordingly per the patient's home medication regimen except as noted.  On day of discharge, patient was felt deemed stable for discharge. Patient/family member advised to call PCP or come back to ER if needed.   Discharge Diagnoses:   Principal Diagnosis: <principal problem not  specified>  Active Hospital Problems   Diagnosis Date Noted  . Alcoholic cirrhosis of liver with ascites (HCC) 07/25/2020    Priority: High  . Secondary esophageal varices without bleeding (HCC) 07/26/2020  . Protein-calorie malnutrition, severe 07/04/2020    Resolved Hospital Problems   Diagnosis Date Noted Date Resolved  . GI bleed 07/25/2020 07/28/2020    Priority: High  . Acute lower GI bleeding 07/25/2020 07/28/2020  . Symptomatic anemia 06/12/2020 07/28/2020    Discharge Instructions    Diet - low sodium heart healthy   Complete by: As directed    Increase activity slowly   Complete by: As directed      Allergies as of 07/28/2020   No Known Allergies     Medication List    STOP taking these medications   ADULT ONE DAILY GUMMIES PO   folic acid 1 MG tablet Commonly known as: FOLVITE   thiamine 100 MG tablet     TAKE these medications   furosemide 40 MG tablet Commonly known as: LASIX Take 1 tablet (40 mg total) by mouth daily.   lactulose 10 GM/15ML solution Commonly known as: CHRONULAC Take 22.5 mLs (15 g total) by mouth daily. What changed:   how much to take  when to take this   nadolol 20 MG tablet Commonly known as: CORGARD Take 1 tablet (20 mg total) by mouth daily. Start taking on: July 29, 2020   pantoprazole 40 MG tablet Commonly known as: Protonix Take 1 tablet (40 mg total) by mouth daily.   spironolactone 100 MG tablet Commonly known as: ALDACTONE Take 1 tablet (100 mg total) by mouth daily.       No Known Allergies  Consultations: GI  Discharge Exam: BP 109/84   Pulse 85   Temp 98.3 F (36.8 C) (Oral)   Resp 14   Ht 5\' 7"  (1.702 m)   Wt 54.4 kg   SpO2 97%   BMI 18.79 kg/m  General appearance: alert, cooperative, fatigued, no distress and grossly jaundiced Head: Normocephalic, without obvious abnormality, atraumatic Eyes: scleral icterus Lungs: clear to auscultation bilaterally Heart: regular rate and rhythm  and S1, S2 normal Abdomen: normal findings: bowel sounds normal and soft, non-tender Extremities: no edema Skin: grossly jaundiced throughout Neurologic: Grossly normal  The results of significant diagnostics from this hospitalization (including imaging, microbiology, ancillary and laboratory) are listed below for reference.   Microbiology: Recent Results (from the past 240 hour(s))  SARS Coronavirus 2 by RT PCR (hospital order, performed in [facility] hospital lab) Nasopharyngeal Nasopharyngeal Swab     Status: None   Collection Time: 07/25/20  1:04 PM   Specimen: Nasopharyngeal Swab  Result Value Ref Range Status   SARS Coronavirus 2 NEGATIVE NEGATIVE Final    Comment: (NOTE) SARS-CoV-2 target nucleic acids are NOT DETECTED.  The SARS-CoV-2 RNA is generally detectable in upper and lower respiratory specimens during the acute phase of infection. The lowest concentration of SARS-CoV-2 viral copies this assay can detect is 250 copies / mL. A negative result does not preclude SARS-CoV-2 infection and should not be used  as the sole basis for treatment or other patient management decisions.  A negative result may occur with improper specimen collection / handling, submission of specimen other than nasopharyngeal swab, presence of viral mutation(s) within the areas targeted by this assay, and inadequate number of viral copies (<250 copies / mL). A negative result must be combined with clinical observations, patient history, and epidemiological information.  Fact Sheet for Patients:   BoilerBrush.com.cy  Fact Sheet for Healthcare Providers: https://pope.com/  This test is not yet approved or  cleared by the [place] FDA and has been authorized for detection and/or diagnosis of SARS-CoV-2 by FDA under an Emergency Use Authorization (EUA).  This EUA will remain in effect (meaning this test can be used) for the duration of  the COVID-19 declaration under Section 564(b)(1) of the Act, 21 U.S.C. section 360bbb-3(b)(1), unless the authorization is terminated or revoked sooner.  Performed at [facility], [address], [place], [place] [address]   Blood culture (routine x 2)     Status: None (Preliminary result)   Collection Time: 07/25/20  1:42 PM   Specimen: BLOOD LEFT HAND  Result Value Ref Range Status   Specimen Description   Final    BLOOD LEFT HAND Performed at [facility], [address], [place], [place] [address]    Special Requests   Final    BOTTLES DRAWN AEROBIC AND ANAEROBIC Blood Culture adequate volume Performed at [facility], [address] [place], [place] [address]    Culture   Final    NO GROWTH 3 DAYS Performed at [facility], [address] [place], [place] [address]    Report Status PENDING  Incomplete  Blood culture (routine x 2)     Status: None (Preliminary result)   Collection Time: 07/25/20  1:47 PM   Specimen: BLOOD  Result Value Ref Range Status   Specimen Description   Final    BLOOD RIGHT ANTECUBITAL Performed at [facility], [address] [place], [place] [address]    Special Requests   Final    BOTTLES DRAWN AEROBIC AND ANAEROBIC Blood Culture results may not be optimal due to an excessive volume of blood received in culture bottles Performed at [facility], [address], [place], [place] [address]    Culture   Final    NO GROWTH 3 DAYS Performed at [facility], [address] [place], [place] [address]    Report Status PENDING  Incomplete     Labs: BNP (last 3 results) No results for input(s): BNP in the last 8760 hours. Basic Metabolic Panel: Recent Labs  Lab 07/25/20 1221 07/26/20 0416 07/27/20 0448 07/28/20 0500  NA 120* 120* 122* 124*  K 4.5 5.4* 4.8 4.7  CL 84* 86* 92* 94*  CO2 24 24 23 23   GLUCOSE 137* 110* 106* 114*   BUN 45* 37* 24* 16  CREATININE 0.92 0.99 0.72 0.67  CALCIUM 8.3* 7.9* 8.0* 7.9*  MG  --   --  2.2 2.2   Liver Function Tests: Recent Labs  Lab 07/25/20 1221 07/27/20 0448 07/28/20 0500  AST 83* 75* 67*  ALT 41 46* 44  ALKPHOS 58 60 76  BILITOT 4.8* 6.8* 6.0*  PROT 5.0* 5.2* 5.3*  ALBUMIN 2.3* 2.3* 2.4*   Recent Labs  Lab 07/25/20 1221  LIPASE 39   Recent Labs  Lab 07/25/20 1221  AMMONIA 37*   CBC: Recent Labs  Lab 07/25/20 1221 07/25/20 2113 07/26/20 0416 07/26/20 1345 07/26/20 2129 07/27/20 0448 07/28/20 0500  WBC 30.7*  --  17.7*  --   --  20.3* 16.3*  NEUTROABS 23.1*  --   --   --   --  15.8* 12.9*  HGB 4.0*   < > 6.6* 8.9* 8.6* 8.9* 8.7*  HCT 11.3*   < > 18.5* 24.3* 24.4* 25.1* 25.4*  MCV 103.7*  --  95.4  --   --  92.3 95.1  PLT 169  --  80*  --   --  113* 126*   < > = values in this interval not displayed.   Cardiac Enzymes: No results for input(s): CKTOTAL, CKMB, CKMBINDEX, TROPONINI in the last 168 hours. BNP: Invalid input(s): POCBNP CBG: No results for input(s): GLUCAP in the last 168 hours. D-Dimer No results for input(s): DDIMER in the last 72 hours. Hgb A1c No results for input(s): HGBA1C in the last 72 hours. Lipid Profile No results for input(s): CHOL, HDL, LDLCALC, TRIG, CHOLHDL, LDLDIRECT in the last 72 hours. Thyroid function studies No results for input(s): TSH, T4TOTAL, T3FREE, THYROIDAB in the last 72 hours.  Invalid input(s): FREET3 Anemia work up Recent Labs    07/27/20 0448 07/28/20 0500  VITAMINB12  --  1,989*  FOLATE 13.9  --    Urinalysis    Component Value Date/Time   COLORURINE AMBER (A) 07/03/2020 1145   APPEARANCEUR CLEAR 07/03/2020 1145   LABSPEC 1.029 07/03/2020 1145   PHURINE 5.0 07/03/2020 1145   GLUCOSEU 50 (A) 07/03/2020 1145   HGBUR NEGATIVE 07/03/2020 1145   BILIRUBINUR MODERATE (A) 07/03/2020 1145   KETONESUR 5 (A) 07/03/2020 1145   PROTEINUR 30 (A) 07/03/2020 1145   NITRITE NEGATIVE 07/03/2020  1145   LEUKOCYTESUR NEGATIVE 07/03/2020 1145   Sepsis Labs Invalid input(s): PROCALCITONIN,  WBC,  LACTICIDVEN Microbiology Recent Results (from the past 240 hour(s))  SARS Coronavirus 2 by RT PCR (hospital order, performed in [facility] hospital lab) Nasopharyngeal Nasopharyngeal Swab     Status: None   Collection Time: 07/25/20  1:04 PM   Specimen: Nasopharyngeal Swab  Result Value Ref Range Status   SARS Coronavirus 2 NEGATIVE NEGATIVE Final    Comment: (NOTE) SARS-CoV-2 target nucleic acids are NOT DETECTED.  The SARS-CoV-2 RNA is generally detectable in upper and lower respiratory specimens during the acute phase of infection. The lowest concentration of SARS-CoV-2 viral copies this assay can detect is 250 copies / mL. A negative result does not preclude SARS-CoV-2 infection and should not be used as the sole basis for treatment or other patient management decisions.  A negative result may occur with improper specimen collection / handling, submission of specimen other than nasopharyngeal swab, presence of viral mutation(s) within the areas targeted by this assay, and inadequate number of viral copies (<250 copies / mL). A negative result must be combined with clinical observations, patient history, and epidemiological information.  Fact Sheet for Patients:   BoilerBrush.com.cyhttps://www.fda.gov/media/136312/download  Fact Sheet for Healthcare Providers: https://pope.com/https://www.fda.gov/media/136313/download  This test is not yet approved or  cleared by the [place] FDA and has been authorized for detection and/or diagnosis of SARS-CoV-2 by FDA under an Emergency Use Authorization (EUA).  This EUA will remain in effect (meaning this test can be used) for the duration of the COVID-19 declaration under Section 564(b)(1) of the Act, 21 U.S.C. section 360bbb-3(b)(1), unless the authorization is terminated or revoked sooner.  Performed at [facility], [address] [place], [place] [address]   Blood culture (routine x 2)     Status: None (Preliminary result)   Collection Time: 07/25/20  1:42 PM   Specimen: BLOOD LEFT HAND  Result Value Ref Range Status   Specimen Description   Final    BLOOD LEFT HAND Performed at [facility], [address], [place], [place] [address]    Special Requests   Final    BOTTLES DRAWN AEROBIC AND ANAEROBIC Blood Culture adequate volume Performed at [facility], [address] [place], [place] [address]    Culture   Final    NO GROWTH 3 DAYS Performed at [facility], [address], [place], [place] [address]    Report Status PENDING  Incomplete  Blood culture (routine x 2)     Status: None (Preliminary result)   Collection Time: 07/25/20  1:47 PM   Specimen: BLOOD  Result Value Ref Range Status   Specimen Description   Final    BLOOD RIGHT ANTECUBITAL Performed at [facility], [address] [place], [place] [address]    Special Requests   Final    BOTTLES DRAWN AEROBIC AND ANAEROBIC Blood Culture results may not be optimal due to an excessive volume of blood received in culture bottles Performed at [facility], [address] [place], [place] [address]    Culture   Final    NO GROWTH 3 DAYS Performed at [facility], [address], [place], [place] [address]    Report Status PENDING  Incomplete    Procedures/Studies: US Paracentesis  Result Date: 07/03/2020 INDICATION: Patient with a history of alcoholic cirrhosis and recurrent ascites. Interventional radiology asked to perform a therapeutic and diagnostic paracentesis. EXAM: ULTRASOUND GUIDED PARACENTESIS MEDICATIONS: 1% lidocaine 10 mL COMPLICATIONS: None immediate. PROCEDURE: Informed written consent was obtained from the patient after a discussion of the risks, benefits and alternatives to treatment. A timeout was performed prior to the initiation of the  procedure. Initial ultrasound scanning demonstrates a large amount of ascites within the right lower abdominal quadrant. The right lower abdomen was prepped and draped in the usual sterile fashion. 1% lidocaine was used for local anesthesia. Following this, a 19 gauge, 7-cm, Yueh catheter was introduced. An ultrasound image was saved for documentation purposes. The paracentesis was performed. The catheter was removed and a dressing was applied. The patient tolerated the procedure well without immediate post procedural complication. FINDINGS: A total of approximately 4 L of dark yellow fluid was removed. Samples were sent to the laboratory as requested by the clinical team. IMPRESSION: Successful ultrasound-guided paracentesis yielding 4 liters of peritoneal fluid. Read by: Alwyn Ren, NP Electronically Signed   By: Corlis Leak M.D.   On: 07/03/2020 14:11   DG Chest Port 1 View  Result Date: 07/25/2020 CLINICAL DATA:  Syncope EXAM: PORTABLE CHEST 1 VIEW COMPARISON:  July 02, 2020 FINDINGS: The lungs are clear. The heart size and pulmonary vascularity are normal. No adenopathy. No pneumothorax. No bone lesions. IMPRESSION: Lungs clear.  Cardiac silhouette within normal limits. Electronically Signed   By: Bretta Bang III M.D.   On: 07/25/2020 13:07   DG CHEST PORT 1 VIEW  Result Date: 07/02/2020 CLINICAL DATA:  Hepatic encephalopathy EXAM: PORTABLE CHEST 1 VIEW COMPARISON:  None FINDINGS: Low lung volumes and streaky basilar atelectasis. No consolidation, features of edema, pneumothorax, or effusion. Pulmonary vascularity is normally distributed. The cardiomediastinal contours are unremarkable. No acute  osseous or soft tissue abnormality. IMPRESSION: Low volumes and atelectasis without other acute cardiopulmonary abnormality. Electronically Signed   By: Kreg Shropshire M.D.   On: 07/02/2020 20:02   US LIVER DOPPLER  Result Date: 07/03/2020 CLINICAL DATA:  Ascites, rule out Budd-Chiari syndrome EXAM:  DUPLEX ULTRASOUND OF LIVER TECHNIQUE: Color and duplex Doppler ultrasound was performed to evaluate the hepatic in-flow and out-flow vessels. COMPARISON:  CT 06/13/2020 FINDINGS: Liver: Normal parenchymal echogenicity. Minimally nodular hepatic contour. No biliary ductal dilatation.  0.8 cm probable cyst, left lobe. Main Portal Vein size: 0.9 cm Portal Vein Velocities (all hepatopetal): Main Prox:  22 cm/sec Main Mid: 28 cm/sec Main Dist:  21 cm/sec Right: 24 cm/sec Left: 10 cm/sec Hepatic Vein Velocities (all hepatofugal): Right:  72 cm/sec Middle:  88 cm/sec Left:  39 cm/sec IVC: Patent with normal color Doppler flow signal Hepatic Artery Velocity:  233 cm/sec Splenic Vein Velocity:  46 cm/sec Spleen: 9.4 cm x 4.5 cm x 13 cm with a total volume of 287 cm^3 (411 cm^3 is upper limit normal) Portal Vein Occlusion/Thrombus: No Splenic Vein Occlusion/Thrombus: No Ascites: Present, moderate to large volume Varices: None identified IMPRESSION: 1. Unremarkable hepatic vascular Doppler evaluation. No evidence of Budd-Chiari syndrome. 2. Abdominal ascites Electronically Signed   By: Corlis Leak M.D.   On: 07/03/2020 11:43    Time coordinating discharge: Over 30 minutes   Lewie Chamber, MD  [facility] 07/28/2020, 1:14 PM Pager: Secure chat  If 7PM-7AM, please contact night-coverage [email or web address] Password [email or web address]

## 2020-07-29 ENCOUNTER — Other Ambulatory Visit: Payer: Self-pay | Admitting: Gastroenterology

## 2020-07-30 LAB — CULTURE, BLOOD (ROUTINE X 2)
Culture: NO GROWTH
Culture: NO GROWTH
Special Requests: ADEQUATE

## 2020-08-01 ENCOUNTER — Other Ambulatory Visit: Payer: Self-pay | Admitting: Gastroenterology

## 2020-08-02 MED FILL — FUROSEMIDE 40 MG TAB: 40 | 30 days supply | Qty: 30 | Fill #0

## 2020-08-02 MED FILL — SPIRONOLACTONE 25 MG TABLET: 25 | 30 days supply | Qty: 30 | Fill #0

## 2020-08-17 NOTE — Progress Notes (Signed)
Patient ID: Matthew [name], male   DOB: 1986/08/31, 34 y.o.   MRN: [id number]   Virtual Visit via Video Note  I connected with Matthew [name] on 08/18/20 at  1:30 PM EDT by a video enabled telemedicine application and verified that I am speaking with the correct person using two identifiers.   I discussed the limitations of evaluation and management by telemedicine and the availability of in person appointments. The patient expressed understanding and agreed to proceed.  PATIENT visit by video virtually in the context of Covid-19 pandemic. Patient location:  home My Location:  [facility] office Persons on the call:  Me and the patient   History of Present Illness: After ED visit 07/25/2020 and trouble from esophageal varices and liver.  He is feeling better.  He has not had alcohol since May.  He is having paresthesias in his hands and feet that seemed better when he was taking lactulose tid.  Now he is taking it once daily.  Doesn't see a need for 12 step meetings but is doing well without any drinking alcohol.  Followed by GI for varices.  No N/V/D/abdominal pain.  He feels stronger daily.    From discharge summary: Patient is a 34 year old male with cirrhosis with complications who is presenting with repeated near syncope who has heme positive stools and a new hemoglobin of 4 from 9 2 weeks ago.  Patient has no localized abdominal pain and no ascites at this time concerning for SBP.  Patient does have a leukocytosis of 30,000 which has an unclear pathology.  He has no cough, congestion or chest pain.  Oxygen saturation of 100% on room air.  Patient is mildly tachycardic here with a systolic flow murmur which is most likely related to his anemia.  Blood pressure remained stable.  Patient given IV Protonix.  2 units of blood ordered.  Will consult GI for further care.  Patient initially refused EGD but thought you were awake while you underwent it and he said he would not be able to tolerate  that.  Discussed with the patient he would be sedated during this procedure and it may be very important to find out where the bleeding is coming from and he reports that he would consider it.  CMP today shows new hyponatremia of 120 with preserved creatinine and improving total bilirubin of 4.8.  AST is still elevated at 83 but not significantly changed.  Lipase within normal limits.  Patient will need admission for further care.   Observations/Objective:  NAD.  Appears healthy, no jaundice.  A&Ox3   Assessment and Plan: 1. Alcoholic cirrhosis of liver with ascites (HCC) Continue spironolactone and furosemide and lactulose(all RF) - CBC with Differential/Platelet; Future - Ammonia; Future - Comprehensive metabolic panel; Future - Vitamin D, 25-hydroxy; Future  2. Secondary esophageal varices without bleeding (HCC) RF protonix - CBC with Differential/Platelet; Future  3. Hyperbilirubinemia Continue lactulose once daily and RF - Ammonia; Future - Comprehensive metabolic panel; Future  4. Encounter for examination following treatment at hospital  5. Neuropathy Gabapentin 100mg  tid - CBC with Differential/Platelet; Future - TSH; Future - Vitamin D, 25-hydroxy; Future    Follow Up Instructions: Assign PCP in 6 weeks   I discussed the assessment and treatment plan with the patient. The patient was provided an opportunity to ask questions and all were answered. The patient agreed with the plan and demonstrated an understanding of the instructions.   The patient was advised to call back or seek an in-person  evaluation if the symptoms worsen or if the condition fails to improve as anticipated.  I provided 15 minutes of non-face-to-face time during this encounter.   [place], PA-C

## 2020-08-18 ENCOUNTER — Other Ambulatory Visit: Payer: Self-pay

## 2020-08-18 ENCOUNTER — Other Ambulatory Visit: Payer: Self-pay | Admitting: Physician Assistant

## 2020-08-18 ENCOUNTER — Inpatient Hospital Stay: Payer: Self-pay | Admitting: Physician Assistant

## 2020-08-18 ENCOUNTER — Ambulatory Visit: Payer: Self-pay | Attending: Physician Assistant | Admitting: Physician Assistant

## 2020-08-18 DIAGNOSIS — G629 Polyneuropathy, unspecified: Secondary | ICD-10-CM

## 2020-08-18 DIAGNOSIS — I8510 Secondary esophageal varices without bleeding: Secondary | ICD-10-CM

## 2020-08-18 DIAGNOSIS — Z09 Encounter for follow-up examination after completed treatment for conditions other than malignant neoplasm: Secondary | ICD-10-CM

## 2020-08-18 DIAGNOSIS — K7031 Alcoholic cirrhosis of liver with ascites: Secondary | ICD-10-CM

## 2020-08-18 MED ORDER — SPIRONOLACTONE 100 MG PO TABS: 100.0000 mg | ORAL_TABLET | Freq: Every day | ORAL | 3 refills | Status: AC

## 2020-08-18 MED ORDER — NADOLOL 20 MG PO TABS: 20.0000 mg | ORAL_TABLET | Freq: Every day | ORAL | 3 refills | Status: AC

## 2020-08-18 MED ORDER — FUROSEMIDE 40 MG PO TABS: 40.0000 mg | ORAL_TABLET | Freq: Every day | ORAL | 3 refills | Status: AC

## 2020-08-18 MED ORDER — PANTOPRAZOLE SODIUM 40 MG PO TBEC: 40.0000 mg | DELAYED_RELEASE_TABLET | Freq: Every day | ORAL | 3 refills | Status: DC

## 2020-08-18 MED ORDER — GABAPENTIN 100 MG PO CAPS: 100.0000 mg | ORAL_CAPSULE | Freq: Three times a day (TID) | ORAL | 3 refills | Status: DC

## 2020-08-18 MED ORDER — LACTULOSE 10 GM/15ML PO SOLN: 15.0000 g | Freq: Every day | ORAL | 2 refills | Status: AC

## 2020-08-18 MED FILL — LACTULOSE 10 GM/15 ML SOLN: 10 | 30 days supply | Qty: 675 | Fill #0

## 2020-08-18 MED FILL — NADOLOL 20 MG TAB: 20 | 30 days supply | Qty: 30 | Fill #0

## 2020-08-18 MED FILL — PANTOPRAZOLE SOD DR 40 MG T: 40 | 30 days supply | Qty: 30 | Fill #0

## 2020-08-18 MED FILL — GABAPENTIN 100 MG CAPSULE: 100 | 30 days supply | Qty: 90 | Fill #0

## 2020-08-26 MED FILL — NADOLOL 20 MG TAB: 20 | 30 days supply | Qty: 30 | Fill #1

## 2020-08-26 MED FILL — SPIRONOLACTONE 25 MG TABLET: 25 | 30 days supply | Qty: 30 | Fill #1

## 2020-08-26 MED FILL — FUROSEMIDE 40 MG TAB: 40 | 30 days supply | Qty: 30 | Fill #1

## 2020-08-26 MED FILL — PANTOPRAZOLE SOD DR 40 MG T: 40 | 30 days supply | Qty: 30 | Fill #1

## 2020-08-30 ENCOUNTER — Encounter (HOSPITAL_COMMUNITY): Payer: Self-pay | Admitting: Gastroenterology

## 2020-08-30 NOTE — Progress Notes (Signed)
Matthew [name] stated he needs to cancel procedure for 09/09/2020.

## 2020-09-06 ENCOUNTER — Other Ambulatory Visit (HOSPITAL_COMMUNITY): Payer: Self-pay

## 2020-09-09 ENCOUNTER — Encounter (HOSPITAL_COMMUNITY): Payer: Self-pay

## 2020-09-09 ENCOUNTER — Ambulatory Visit (HOSPITAL_COMMUNITY): Admit: 2020-09-09 | Payer: MEDICAID | Admitting: Gastroenterology

## 2020-09-09 HISTORY — DX: Polyneuropathy, unspecified: G62.9

## 2020-09-09 HISTORY — DX: Personal history of other medical treatment: Z92.89

## 2020-09-09 HISTORY — DX: Esophageal varices without bleeding: I85.00

## 2020-09-09 HISTORY — DX: Alcoholic cirrhosis of liver without ascites: K70.30

## 2020-09-09 HISTORY — DX: Anemia, unspecified: D64.9

## 2020-09-09 HISTORY — DX: Other ascites: R18.8

## 2020-09-09 SURGERY — ESOPHAGOGASTRODUODENOSCOPY (EGD) WITH PROPOFOL
Anesthesia: Monitor Anesthesia Care

## 2020-09-12 ENCOUNTER — Telehealth: Payer: Self-pay | Admitting: *Deleted

## 2020-09-12 NOTE — Telephone Encounter (Signed)
Patient name and DOB verified.  Had question regarding gabapentin dosage. He requested an increase in the medications. He stated pain is still there.   Informed that Mrs. Sharon Seller, PA-C was only in the office on Wednesdays and Thursdays and she recommended his assign care with a PCP in 6 weeks. Patient did not wish to do this today.    Pls advise.

## 2020-09-15 ENCOUNTER — Other Ambulatory Visit: Payer: Self-pay | Admitting: Physician Assistant

## 2020-09-15 MED ORDER — GABAPENTIN 300 MG PO CAPS: 300.0000 mg | ORAL_CAPSULE | Freq: Three times a day (TID) | ORAL | 3 refills | Status: DC

## 2020-09-15 MED FILL — GABAPENTIN 300 MG CAPSULE: 300 | 30 days supply | Qty: 90 | Fill #0

## 2020-09-15 NOTE — Telephone Encounter (Signed)
I increased his dose of gabapentin to 300mg  1 tablet 3 times daily.  He can discontinue the 100mg  tablets.  Thanks, , PA-C

## 2020-09-16 NOTE — Telephone Encounter (Signed)
Patient name and DOB verified. Patient aware of new dose. Has picked up Rx this morning.

## 2020-09-22 MED FILL — PANTOPRAZOLE SOD DR 40 MG T: 40 | 30 days supply | Qty: 30 | Fill #1

## 2020-09-22 MED FILL — FUROSEMIDE 40 MG TAB: 40 | 30 days supply | Qty: 30 | Fill #2

## 2020-09-22 MED FILL — SPIRONOLACTONE 25 MG TABLET: 25 | 30 days supply | Qty: 30 | Fill #2

## 2020-09-22 MED FILL — NADOLOL 20 MG TAB: 20 | 30 days supply | Qty: 30 | Fill #2

## 2020-09-23 MED FILL — LACTULOSE 10 GM/15 ML SOLN: 10 | 30 days supply | Qty: 675 | Fill #1

## 2020-10-13 MED FILL — FUROSEMIDE 40 MG TAB: 40 | 30 days supply | Qty: 30 | Fill #3

## 2020-10-13 MED FILL — GABAPENTIN 300 MG CAPSULE: 300 | 30 days supply | Qty: 90 | Fill #1

## 2020-10-13 MED FILL — NADOLOL 20 MG TAB: 20 | 30 days supply | Qty: 30 | Fill #3

## 2020-10-13 MED FILL — LACTULOSE 10 GM/15 ML SOLN: 10 | 30 days supply | Qty: 675 | Fill #2

## 2020-10-13 MED FILL — SPIRONOLACTONE 25 MG TABLET: 25 | 30 days supply | Qty: 30 | Fill #3

## 2020-10-13 MED FILL — PANTOPRAZOLE SOD DR 40 MG T: 40 | 30 days supply | Qty: 30 | Fill #2

## 2020-10-27 ENCOUNTER — Other Ambulatory Visit: Payer: Self-pay | Admitting: Gastroenterology

## 2020-11-10 MED FILL — GABAPENTIN 300 MG CAPSULE: 300 | 30 days supply | Qty: 90 | Fill #2

## 2020-11-10 MED FILL — LACTULOSE 10 GM/15 ML SOLN: 10 | 31 days supply | Qty: 473 | Fill #0

## 2020-11-10 MED FILL — NADOLOL 20 MG TAB: 20 | 30 days supply | Qty: 30 | Fill #0

## 2020-11-10 MED FILL — FUROSEMIDE 40 MG TAB: 40 | 30 days supply | Qty: 30 | Fill #4

## 2020-11-10 MED FILL — PANTOPRAZOLE SOD DR 40 MG T: 40 | 30 days supply | Qty: 30 | Fill #3

## 2020-11-10 MED FILL — SPIRONOLACTONE 25 MG TABLET: 25 | 30 days supply | Qty: 30 | Fill #4

## 2020-12-12 ENCOUNTER — Telehealth: Payer: Self-pay | Admitting: General Practice

## 2020-12-12 MED FILL — GABAPENTIN 300 MG CAPSULE: 300 | 30 days supply | Qty: 90 | Fill #3

## 2020-12-12 MED FILL — PANTOPRAZOLE SOD DR 40 MG T: 40 | 30 days supply | Qty: 30 | Fill #0

## 2020-12-12 MED FILL — SPIRONOLACTONE 25 MG TABLET: 25 | 30 days supply | Qty: 30 | Fill #5

## 2020-12-12 MED FILL — FUROSEMIDE 40 MG TAB: 40 | 30 days supply | Qty: 30 | Fill #5

## 2020-12-12 MED FILL — NADOLOL 20 MG TAB: 20 | 30 days supply | Qty: 30 | Fill #1

## 2020-12-12 MED FILL — LACTULOSE 10 GM/15 ML SOLN: 10 | 31 days supply | Qty: 473 | Fill #1

## 2020-12-12 NOTE — Telephone Encounter (Signed)
Medication: gabapentin (NEURONTIN) 300 MG capsule [[id number]] , pantoprazole (PROTONIX) 40 MG tablet [[id number]] , spironolactone (ALDACTONE) 100 MG tablet [[id number]] , nadolol (CORGARD) 20 MG tablet [[id number]] , furosemide (LASIX) 40 MG tablet [[id number]] , lactulose (CHRONULAC) 10 GM/15ML solution [[id number]]  ENDED  Has the patient contacted their pharmacy? YES  (Agent: If no, request that the patient contact the pharmacy for the refill.) (Agent: If yes, when and what did the pharmacy advise?)  Preferred Pharmacy (with phone number or street name): [facility] - [place], [place] - [address] [place] [address] Phone: [phone] Fax: [phone] Hours: Not open 24 hours    Agent: Please be advised that RX refills may take up to 3 business days. We ask that you follow-up with your pharmacy.

## 2020-12-13 NOTE — Telephone Encounter (Signed)
Patient has picked these up from our pharmacy.

## 2021-03-11 ENCOUNTER — Other Ambulatory Visit: Payer: Self-pay

## 2022-05-21 IMAGING — CT CT ABD-PELV W/ CM
2 of 5 series · 16 of 46 positions shown, 18 images · IV contrast (OMNIPAQUE)
Comparison: Abdominal ultrasound dated 06/12/2020.

CLINICAL DATA: Ascites with diffuse abdominal pain and nausea.

EXAM:
CT ABDOMEN AND PELVIS WITH CONTRAST
TECHNIQUE: Multidetector CT imaging of the abdomen and pelvis was performed
using the standard protocol following bolus administration of
intravenous contrast.
CONTRAST:  100mL OMNIPAQUE IOHEXOL 300 MG/ML  SOLN

[Series 2: axial st · axial · 0.74mm/px · z∈[-430,+10]mm · 13 of 100 slices shown, 15 images]
[im 6/100  soft-tissue]
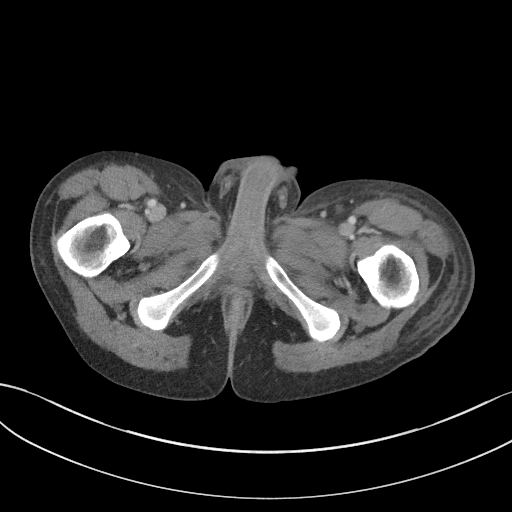
[im 6/100  bone]
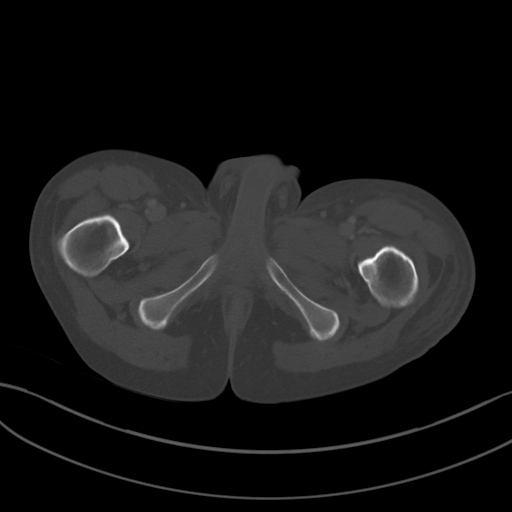
[im 12/100  soft-tissue]
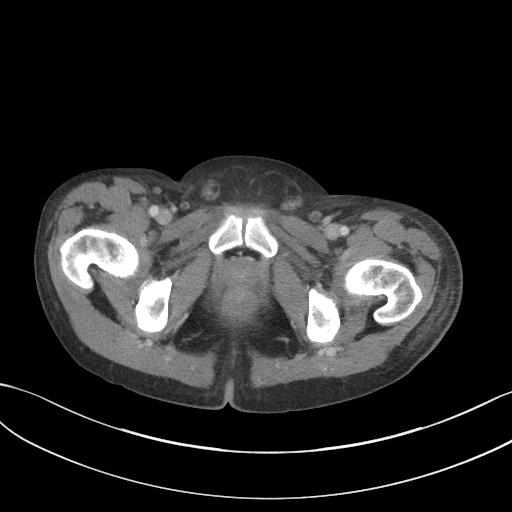
[im 23/100  soft-tissue]
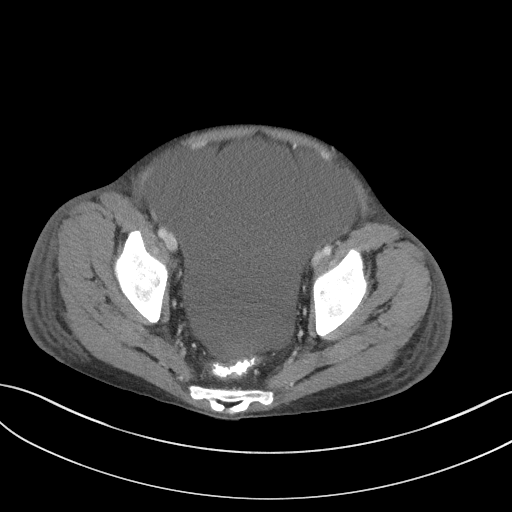
[im 28/100  soft-tissue]
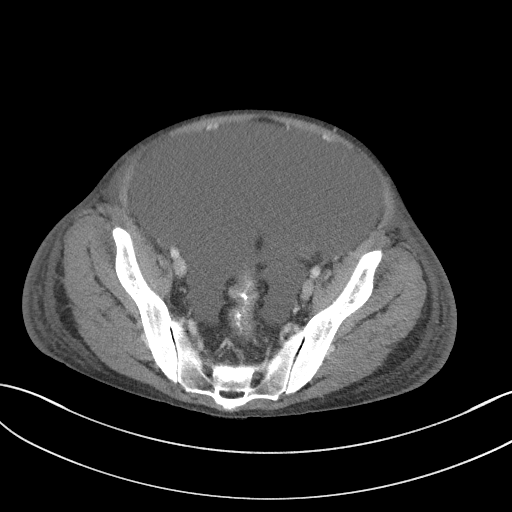
[im 34/100  soft-tissue]
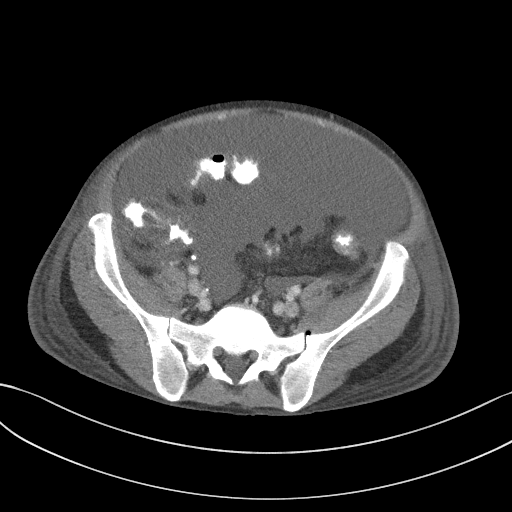
[im 45/100  soft-tissue]
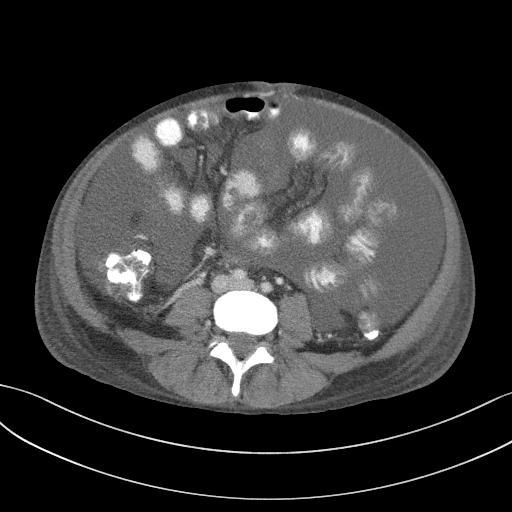
[im 50/100  soft-tissue]
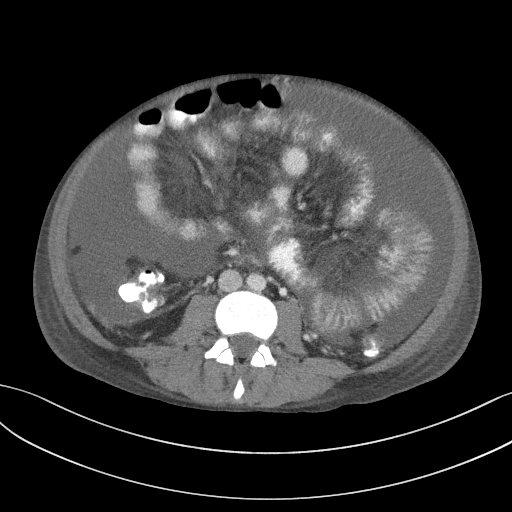
[im 56/100  soft-tissue]
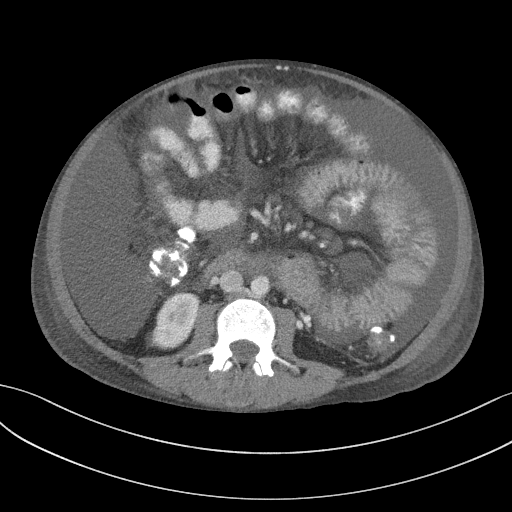
[im 67/100  soft-tissue]
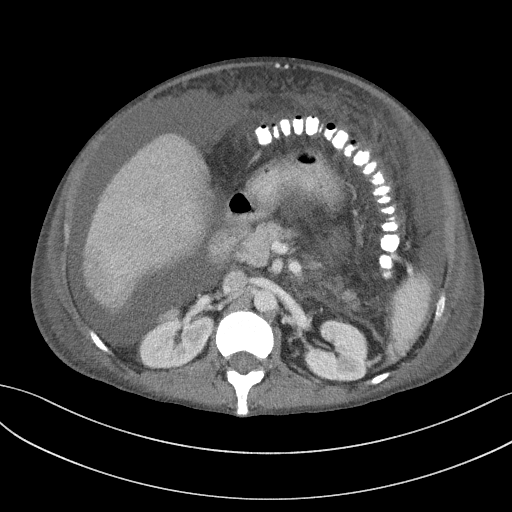
[im 67/100  bone]
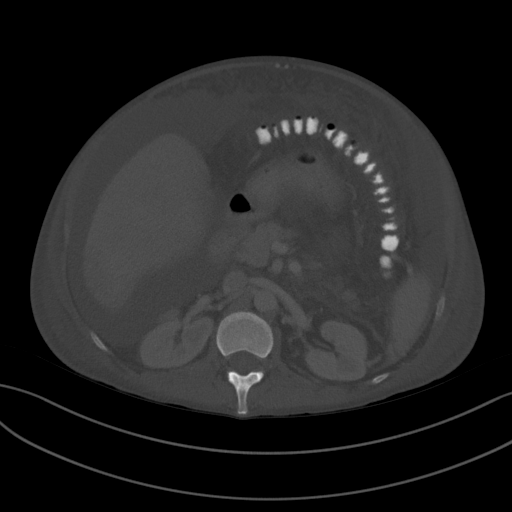
[im 72/100  soft-tissue]
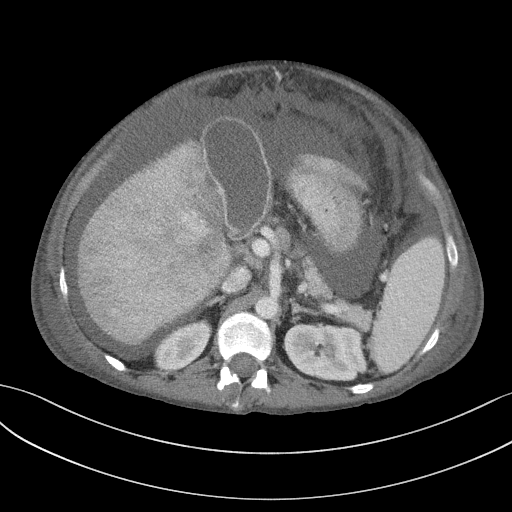
[im 78/100  soft-tissue]
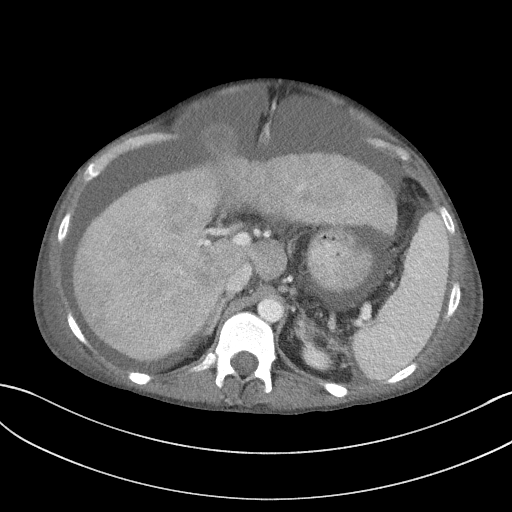
[im 89/100  soft-tissue]
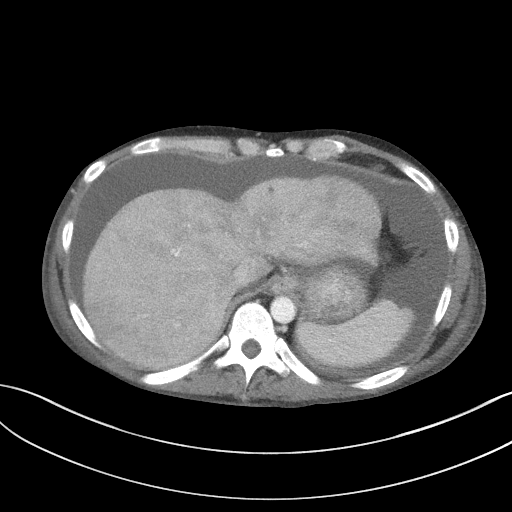
[im 94/100  soft-tissue]
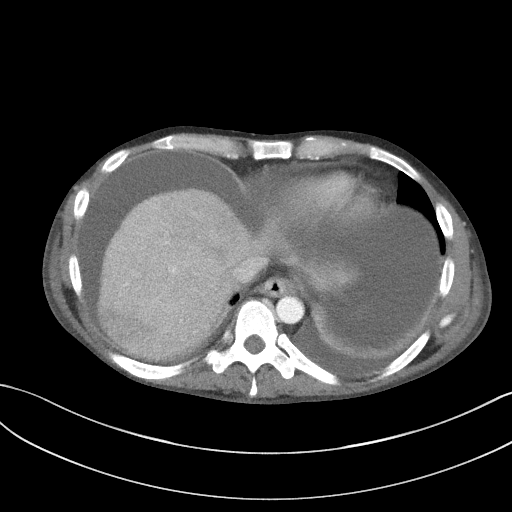

[Series 7: coronal st · coronal · 0.82mm/px · 3 of 95 slices shown]
[im 32/95  soft-tissue]
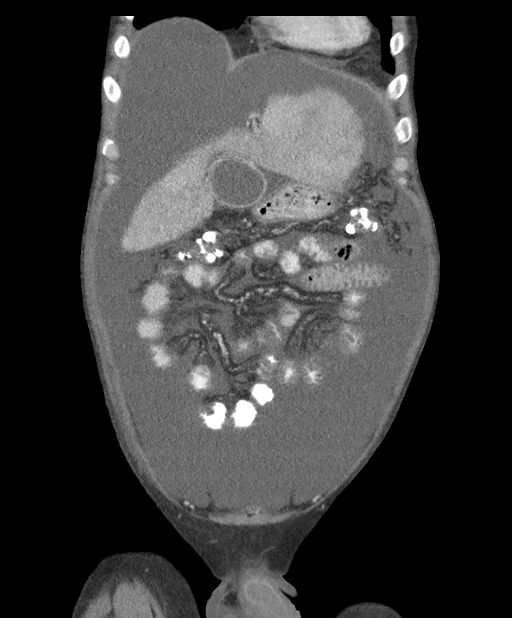
[im 42/95  soft-tissue]
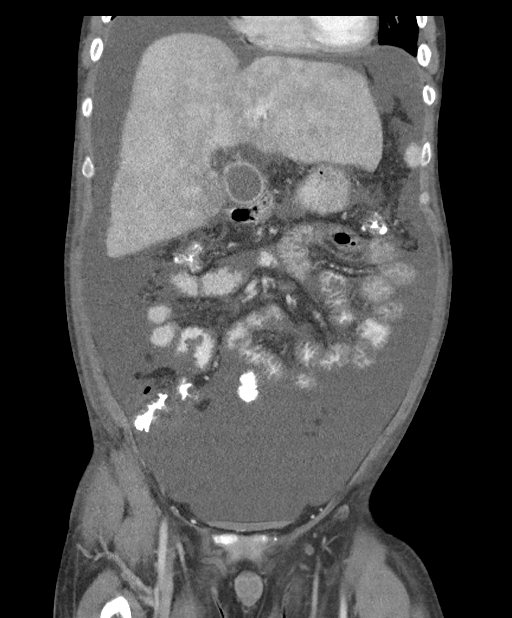
[im 53/95  soft-tissue]
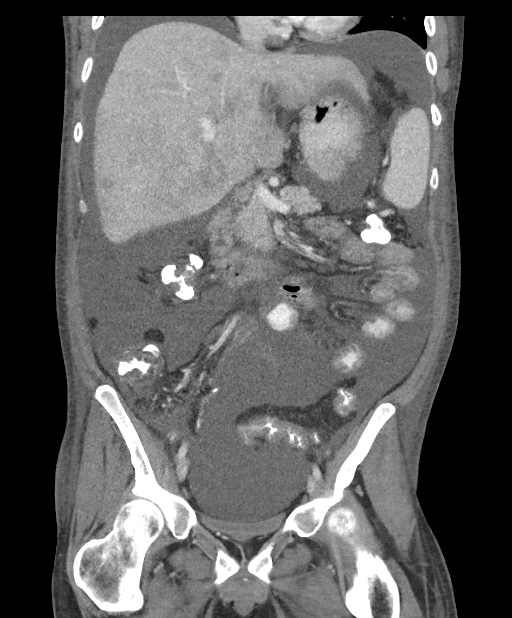

[16 of 46 positions shown; findings below may reference images not displayed]

FINDINGS: Lower chest: Moderate bilateral pleural effusions with associated
atelectasis are partially imaged.

Hepatobiliary: The liver is heterogeneous with a nodular surface
contour, consistent with hepatic cirrhosis. A 4 mm hypoattenuating
lesion in hepatic segment 4A likely represents a benign cyst which
was seen on prior ultrasound. A gallstone versus layering sludge is
seen in the gallbladder neck. No gallbladder wall thickening or
biliary dilatation.

Pancreas: Unremarkable. No pancreatic ductal dilatation or
surrounding inflammatory changes.

Spleen: Enlarged without focal abnormality.

Adrenals/Urinary Tract: Adrenal glands are unremarkable. Kidneys are
normal, without renal calculi, focal lesion, or hydronephrosis.
Bladder is nondistended.

Stomach/Bowel: Stomach is within normal limits. Enteric contrast
reaches the rectum. Appendix appears normal. No evidence of bowel
wall thickening, distention, or inflammatory changes.

Vascular/Lymphatic: The middle and left hepatic veins are not well
seen which may be related to the phase of contrast. No enlarged
abdominal or pelvic lymph nodes.

Reproductive: Prostate is unremarkable.

Other: No abdominal wall hernia or abnormality. Large volume
ascites.

Musculoskeletal: No acute or significant osseous findings.
IMPRESSION: 1. The middle and left hepatic veins are not seen which may be
related to the phase of contrast, however an abdominal ultrasound
with liver Doppler could be considered to assess the hepatic veins
(Budd-Chiari syndrome).
2. Hepatic cirrhosis with sequelae of portal hypertension.
3. Moderate bilateral pleural effusions with associated atelectasis.
4. Cholelithiasis versus layering sludge in the gallbladder neck.

## 2022-05-22 IMAGING — US US PARACENTESIS
1 series · 5 of 5 positions shown · non-contrast
Comparison: none

INDICATION: Patient with history of alcohol abuse, new onset ascites. Request to
IR for diagnostic and therapeutic paracentesis.

[Series 1: us paracentesis · 5 of 5 slices shown]
[im 1/5]
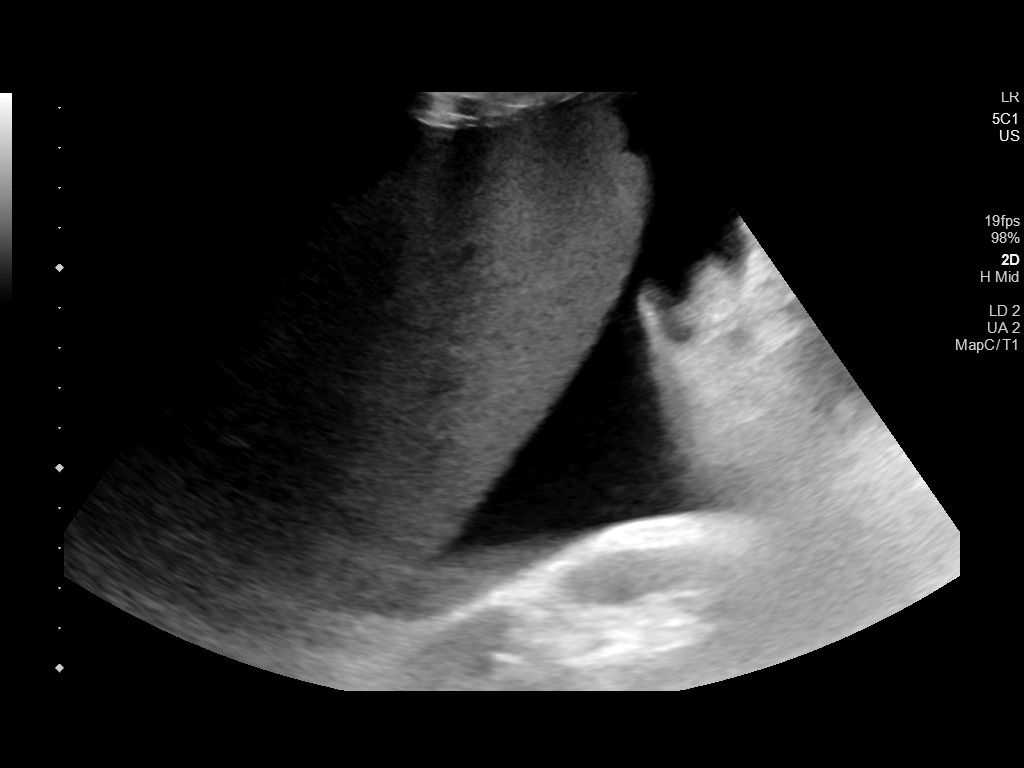
[im 2/5]
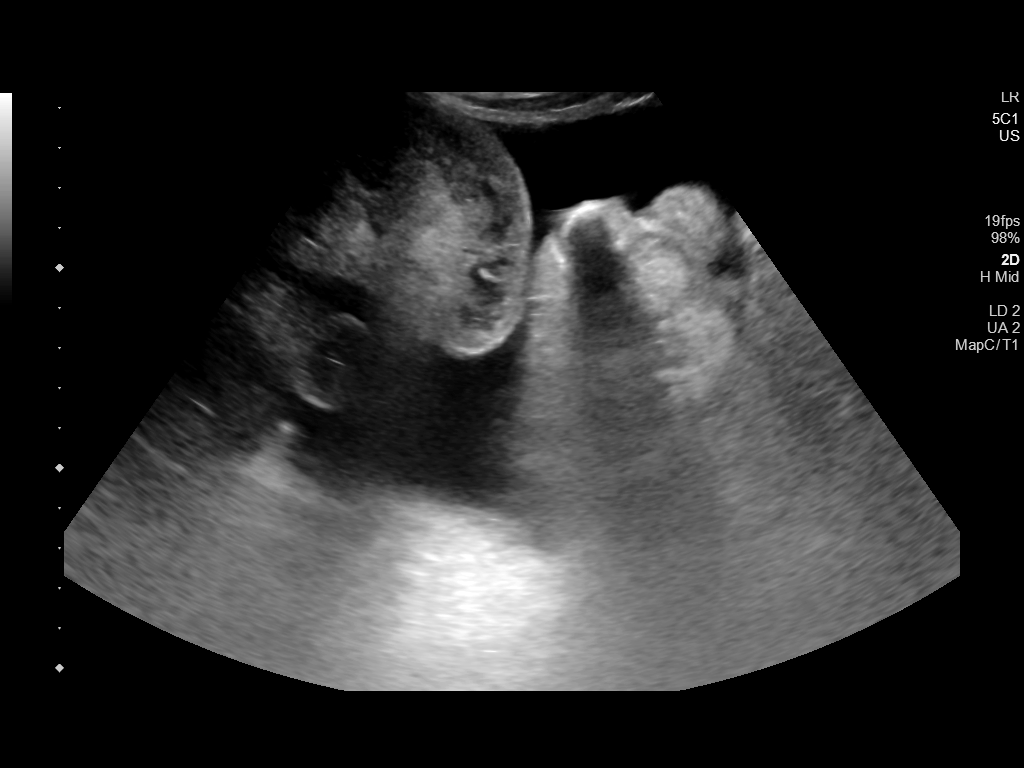
[im 3/5]
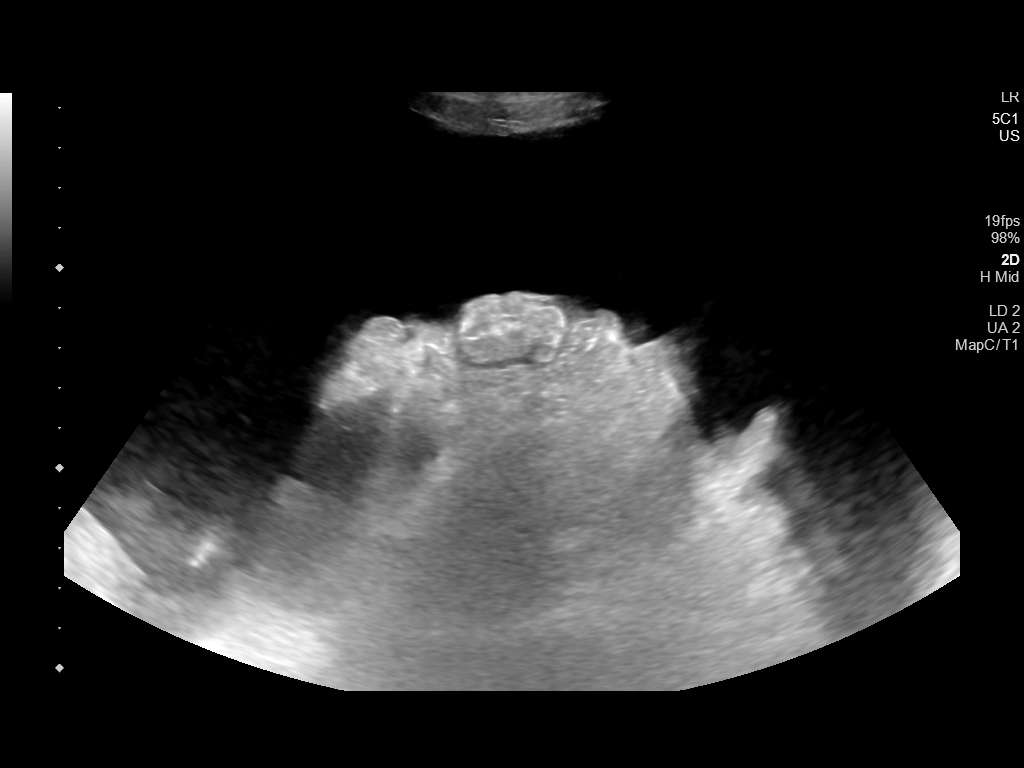
[im 4/5]
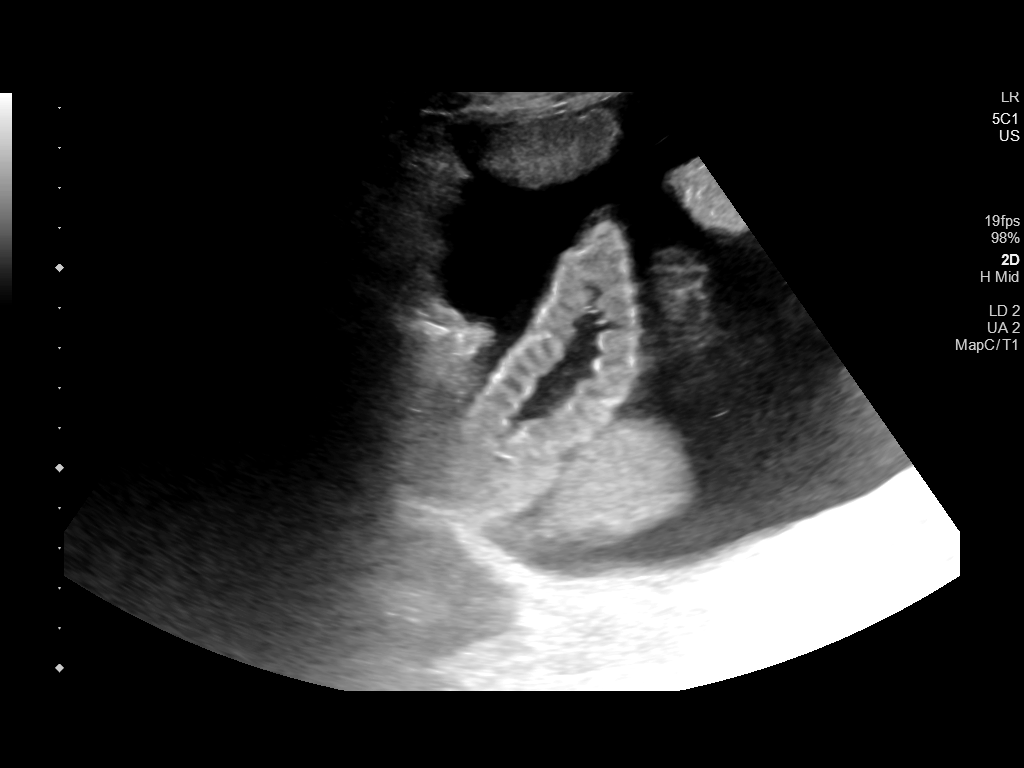
[im 5/5]
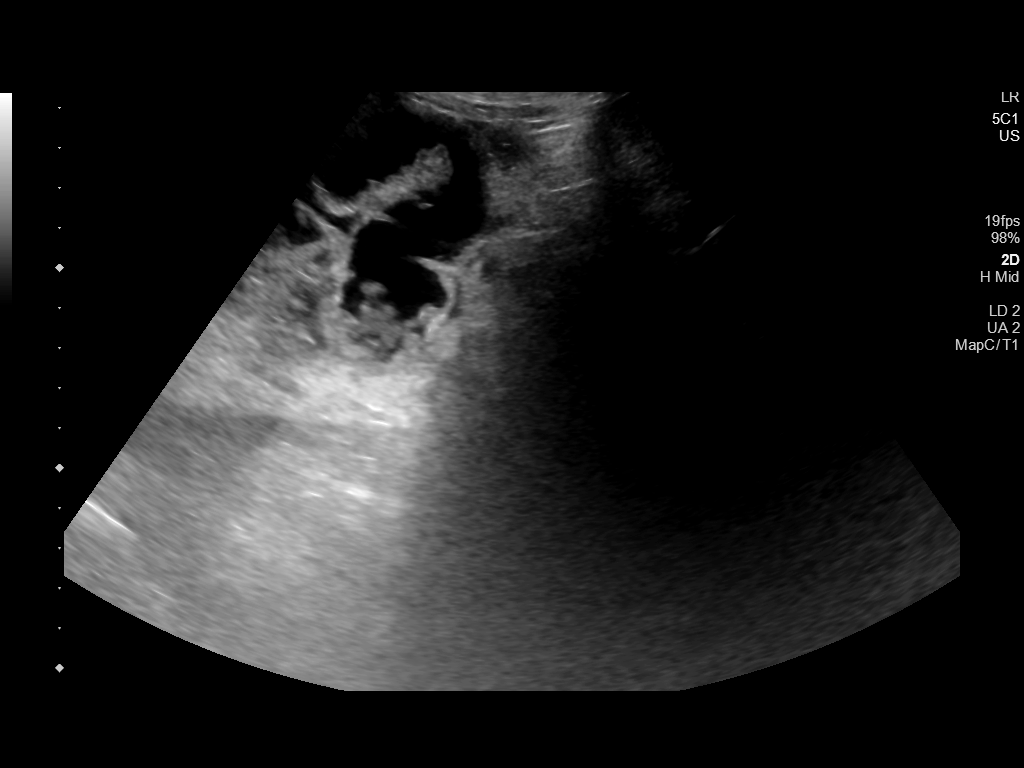

[5 of 5 positions shown; findings below may reference images not displayed]

EXAM:
ULTRASOUND GUIDED DIAGNOSTIC AND THERAPEUTIC PARACENTESIS

MEDICATIONS:
10 mL% lidocaine

COMPLICATIONS:
None immediate.

PROCEDURE:
Informed written consent was obtained from the patient after a
discussion of the risks, benefits and alternatives to treatment. A
timeout was performed prior to the initiation of the procedure.

Initial ultrasound scanning demonstrates a large amount of ascites
within the left lower abdominal quadrant. The left lower abdomen was
prepped and draped in the usual sterile fashion. 1% lidocaine was
used for local anesthesia.

Following this, a 19 gauge, 7-cm, Yueh catheter was introduced. An
ultrasound image was saved for documentation purposes. The
paracentesis was performed. The catheter was removed and a dressing
was applied. The patient tolerated the procedure well without
immediate post procedural complication.
FINDINGS: A total of approximately 4.0 L of hazy yellow fluid was removed.
Samples were sent to the laboratory as requested by the clinical
team.
IMPRESSION: Successful ultrasound-guided paracentesis yielding 4.0 liters of
peritoneal fluid.

Read by Aujla, Danii
